# Patient Record
Sex: Male | Born: 1940 | Race: White | Hispanic: No | Smoking: Never smoker
Health system: Southern US, Community
[De-identification: ages and names within clinical notes are randomized; demographics above are authoritative.]

## PROBLEM LIST (undated history)

## (undated) DIAGNOSIS — E785 Hyperlipidemia, unspecified: Secondary | ICD-10-CM

## (undated) DIAGNOSIS — I10 Essential (primary) hypertension: Secondary | ICD-10-CM

## (undated) DIAGNOSIS — E119 Type 2 diabetes mellitus without complications: Secondary | ICD-10-CM

## (undated) DIAGNOSIS — F419 Anxiety disorder, unspecified: Secondary | ICD-10-CM

## (undated) DIAGNOSIS — N183 Chronic kidney disease, stage 3 (moderate): Secondary | ICD-10-CM

## (undated) DIAGNOSIS — E1169 Type 2 diabetes mellitus with other specified complication: Secondary | ICD-10-CM

## (undated) DIAGNOSIS — R251 Tremor, unspecified: Secondary | ICD-10-CM

## (undated) HISTORY — DX: Chronic kidney disease, stage 3 (moderate): N18.3

## (undated) HISTORY — DX: Essential (primary) hypertension: I10

## (undated) HISTORY — DX: Type 2 diabetes mellitus with other specified complication: E11.69

## (undated) HISTORY — DX: Anxiety disorder, unspecified: F41.9

## (undated) HISTORY — DX: Type 2 diabetes mellitus without complications: E11.9

## (undated) HISTORY — DX: Tremor, unspecified: R25.1

## (undated) HISTORY — DX: Hyperlipidemia, unspecified: E78.5

---

## 2004-06-23 ENCOUNTER — Ambulatory Visit: Payer: Self-pay | Admitting: Internal Medicine

## 2004-06-30 ENCOUNTER — Ambulatory Visit: Payer: Self-pay | Admitting: Internal Medicine

## 2004-07-31 ENCOUNTER — Ambulatory Visit: Payer: Self-pay | Admitting: Internal Medicine

## 2008-05-07 ENCOUNTER — Ambulatory Visit: Payer: Self-pay | Admitting: Gastroenterology

## 2011-10-20 ENCOUNTER — Ambulatory Visit: Payer: Self-pay | Admitting: Ophthalmology

## 2011-11-03 ENCOUNTER — Ambulatory Visit: Payer: Self-pay | Admitting: Ophthalmology

## 2011-12-29 ENCOUNTER — Ambulatory Visit: Payer: Self-pay | Admitting: Ophthalmology

## 2013-06-27 ENCOUNTER — Ambulatory Visit: Payer: Self-pay | Admitting: Gastroenterology

## 2013-06-29 LAB — PATHOLOGY REPORT

## 2013-09-02 DIAGNOSIS — E1169 Type 2 diabetes mellitus with other specified complication: Secondary | ICD-10-CM

## 2013-09-02 DIAGNOSIS — E785 Hyperlipidemia, unspecified: Secondary | ICD-10-CM

## 2013-09-02 DIAGNOSIS — E119 Type 2 diabetes mellitus without complications: Secondary | ICD-10-CM | POA: Insufficient documentation

## 2013-09-02 DIAGNOSIS — I10 Essential (primary) hypertension: Secondary | ICD-10-CM

## 2013-09-02 DIAGNOSIS — F419 Anxiety disorder, unspecified: Secondary | ICD-10-CM

## 2013-09-02 DIAGNOSIS — R251 Tremor, unspecified: Secondary | ICD-10-CM

## 2013-09-02 HISTORY — DX: Tremor, unspecified: R25.1

## 2013-09-02 HISTORY — DX: Hyperlipidemia, unspecified: E11.69

## 2013-09-02 HISTORY — DX: Anxiety disorder, unspecified: F41.9

## 2013-09-02 HISTORY — DX: Essential (primary) hypertension: I10

## 2013-09-02 HISTORY — DX: Type 2 diabetes mellitus without complications: E11.9

## 2013-09-12 DIAGNOSIS — N183 Chronic kidney disease, stage 3 unspecified: Secondary | ICD-10-CM

## 2013-09-12 HISTORY — DX: Chronic kidney disease, stage 3 unspecified: N18.30

## 2014-02-05 DIAGNOSIS — Z85828 Personal history of other malignant neoplasm of skin: Secondary | ICD-10-CM | POA: Insufficient documentation

## 2014-06-19 NOTE — Op Note (Signed)
PATIENT NAME:  Randy Mcmillan, MAZO MR#:  [id number] DATE OF BIRTH:  03/31/1940  DATE OF PROCEDURE:  12/29/2011  PREOPERATIVE DIAGNOSIS: Visually significant cataract of the right eye.   POSTOPERATIVE DIAGNOSIS: Visually significant cataract of the right eye.   OPERATIVE PROCEDURE: Cataract extraction by phacoemulsification with implant of intraocular lens to right eye.   SURGEON: Birder Robson, MD   ANESTHESIA:  1. Managed anesthesia care.  2. Topical tetracaine drops followed by 2% Xylocaine jelly applied in the preoperative holding area.   COMPLICATIONS: None.   TECHNIQUE:  Stop and chop.   DESCRIPTION OF PROCEDURE: The patient was examined and consented in the preoperative holding area where the aforementioned topical anesthesia was applied to the right eye and then brought back to the Operating Room where the right eye was prepped and draped in the usual sterile ophthalmic fashion and a lid speculum was placed. A paracentesis was created with the side port blade and the anterior chamber was filled with viscoelastic. A near clear corneal incision was performed with the steel keratome. A continuous curvilinear capsulorrhexis was performed with a cystotome followed by the capsulorrhexis forceps. Hydrodissection and hydrodelineation were carried out with BSS on a blunt cannula. The lens was removed in a stop and chop technique and the remaining cortical material was removed with the irrigation-aspiration handpiece. The capsular bag was inflated with viscoelastic and the Technis ZCBOO   20.5-diopter lens, serial number [id number], was placed in the capsular bag without complication. The remaining viscoelastic was removed from the eye with the irrigation-aspiration handpiece. The wounds were hydrated. The anterior chamber was flushed with Miostat and the eye was inflated to physiologic pressure. The wounds were found to be water tight. The eye was dressed with Vigamox. The patient was given  protective glasses to wear throughout the day and a shield with which to sleep tonight. The patient was also given drops with which to begin a drop regimen today and will follow-up with me in one day.   ____________________________ Livingston Diones. Newton Frutiger, MD wlp:cbb D: 12/29/2011 15:28:20 ET T: 12/29/2011 15:46:29 ET JOB#: [id number]  cc: Abhishek Levesque L. Oumar Marcott, MD, <Dictator> Livingston Diones Deriyah Kunath MD ELECTRONICALLY SIGNED 01/07/2012 10:23

## 2014-06-19 NOTE — Op Note (Signed)
PATIENT NAME:  Randy Mcmillan, Randy Mcmillan MR#:  [id number] DATE OF BIRTH:  1940/09/24  DATE OF PROCEDURE:  11/03/2011  PREOPERATIVE DIAGNOSIS: Visually significant cataract of the left eye.   POSTOPERATIVE DIAGNOSIS: Visually significant cataract of the left eye.   OPERATIVE PROCEDURE: Cataract extraction by phacoemulsification with implant of intraocular lens to the left eye.   SURGEON: Birder Robson, MD.   ANESTHESIA:  1. Managed anesthesia care.  2. Topical tetracaine drops followed by 2% Xylocaine jelly applied in the preoperative holding area.   COMPLICATIONS: None.   TECHNIQUE:  Stop and chop.   DESCRIPTION OF PROCEDURE: The patient was examined and consented in the preoperative holding area where the aforementioned topical anesthesia was applied to the left eye and then brought back to the Operating Room where the left eye was prepped and draped in the usual sterile ophthalmic fashion and a lid speculum was placed. A paracentesis was created with the side port blade and the anterior chamber was filled with viscoelastic. A near clear corneal incision was performed with the steel keratome. A continuous curvilinear capsulorrhexis was performed with a cystotome followed by the capsulorrhexis forceps. Hydrodissection and hydrodelineation were carried out with BSS on a blunt cannula. The lens was removed in a stop and chop technique and the remaining cortical material was removed with the irrigation-aspiration handpiece. The capsular bag was inflated with viscoelastic and the  Tecnis ZCB00 21-diopter lens, serial number [id number] was placed in the capsular bag without complication. The remaining viscoelastic was removed from the eye with the irrigation-aspiration handpiece. The wounds were hydrated. The anterior chamber was flushed with Miostat and the eye was inflated to physiologic pressure. The wounds were found to be water tight. The eye was dressed with Vigamox. The patient was given protective  glasses to wear throughout the day and a shield with which to sleep tonight. The patient was also given drops with which to begin a drop regimen today and will follow up with me in one day.       ____________________________ Randy Diones. Patte Winkel, MD wlp:bjt D: 11/03/2011 12:27:34 ET T: 11/03/2011 13:04:17 ET JOB#: [id number]  cc: Bronnie Vasseur L. Lavaughn Bisig, MD, <Dictator> Randy Diones Talik Casique MD ELECTRONICALLY SIGNED 11/04/2011 13:07

## 2016-12-30 DIAGNOSIS — Z0000 Encounter for general adult medical examination without abnormal findings: Secondary | ICD-10-CM | POA: Insufficient documentation

## 2017-08-10 ENCOUNTER — Encounter: Payer: Self-pay | Admitting: Urology

## 2017-08-10 ENCOUNTER — Ambulatory Visit (INDEPENDENT_AMBULATORY_CARE_PROVIDER_SITE_OTHER): Payer: Medicare Other | Admitting: Urology

## 2017-08-10 VITALS — BP 150/78 | HR 88 | Ht 74.0 in | Wt 216.0 lb

## 2017-08-10 DIAGNOSIS — R972 Elevated prostate specific antigen [PSA]: Secondary | ICD-10-CM | POA: Diagnosis not present

## 2017-08-10 DIAGNOSIS — R351 Nocturia: Secondary | ICD-10-CM

## 2017-08-10 LAB — URINALYSIS, COMPLETE
Bilirubin, UA: NEGATIVE
Leukocytes, UA: NEGATIVE
Nitrite, UA: NEGATIVE
Protein, UA: NEGATIVE
RBC, UA: NEGATIVE
Specific Gravity, UA: 1.02 (ref 1.005–1.030)
Urobilinogen, Ur: 0.2 mg/dL (ref 0.2–1.0)
pH, UA: 5 (ref 5.0–7.5)

## 2017-08-10 LAB — MICROSCOPIC EXAMINATION
Bacteria, UA: NONE SEEN
Epithelial Cells (non renal): NONE SEEN /hpf (ref 0–10)
RBC, UA: NONE SEEN /hpf (ref 0–2)

## 2017-08-10 NOTE — Progress Notes (Signed)
08/10/2017 9:58 AM   Randy Mcmillan [birth year] [id number]  Referring provider: Kirk Ruths, MD [facility] [place], [place] [address]  Chief Complaint  Patient presents with  . Elevated PSA    New Patient    HPI: 77 year old male referred for elevated and rising PSA.  PSA trend as below.  No family of prostate cancer.  No weight loss or bone pain.    He denies any significant urinary symptoms including frequency/ urgency.  He does get up 1-2 x at night if he drink mixed drinks before bed.  If he does not drink these cocktails, he has no issues.  No gross hematuria.  He does have occation rare dysuria which is not consistent or severe.    PSA trend: 4.69 06/23/2017 3.69 09/18/2015 3.60 09/17/2014 2.58 09/05/2013   PMH: Past Medical History:  Diagnosis Date  . Anxiety 09/02/2013   On rare 1/2 mg xanax   Last Assessment & Plan:  Doing well on rare xanax  . Chronic kidney disease, stage III (moderate) ([place]) 09/12/2013  . Essential (primary) hypertension 09/02/2013   Last Assessment & Plan:  Taking medications without noted side effects or dizziness.  . Hyperlipidemia associated with type 2 diabetes mellitus ([place]) 09/02/2013   Last Assessment & Plan:  Diet for healthy cholesterol is being attempted and no clear myalgia's or other side effects are noted.  . Tremor 09/02/2013   Last Assessment & Plan:  Tremor is essentially unchanged.  . Type 2 diabetes mellitus ([place]) 09/02/2013   Last Assessment & Plan:  Has been working on a low fat diet and no myalgia's are present.    Surgical History: History reviewed. No pertinent surgical history.  Home Medications:  Allergies as of 08/10/2017   No Known Allergies     Medication List        Accurate as of 08/10/17  9:58 AM. Always use your most recent med list.          aspirin 81 MG chewable tablet Chew by mouth.   atorvastatin 40 MG tablet Commonly known as:  LIPITOR Take by mouth.   glipiZIDE 5 MG tablet Commonly known as:  GLUCOTROL Take by mouth.   JANUVIA 100 MG tablet Generic drug:  sitaGLIPtin   lisinopril 10 MG tablet Commonly known as:  PRINIVIL,ZESTRIL Take by mouth.   metFORMIN 1000 MG tablet Commonly known as:  GLUCOPHAGE Take by mouth.   sildenafil 20 MG tablet Commonly known as:  REVATIO Take by mouth.       Allergies: No Known Allergies  Family History: History reviewed. No pertinent family history.  Social History:  reports that he has never smoked. He has never used smokeless tobacco. He reports that he drinks alcohol. He reports that he does not use drugs.  ROS: UROLOGY Frequent Urination?: No Hard to postpone urination?: No Burning/pain with urination?: Yes Get up at night to urinate?: Yes Leakage of urine?: No Urine stream starts and stops?: No Trouble starting stream?: No Do you have to strain to urinate?: No Blood in urine?: No Urinary tract infection?: No Sexually transmitted disease?: No Injury to kidneys or bladder?: No Painful intercourse?: No Weak stream?: No Erection problems?: Yes Penile pain?: No  Gastrointestinal Nausea?: No Vomiting?: No Indigestion/heartburn?: No Diarrhea?: No Constipation?: No  Constitutional Fever: No Night sweats?: No Weight loss?: No Fatigue?: No  Skin Skin rash/lesions?: No Itching?: No  Eyes Blurred vision?: No Double vision?: No  Ears/Nose/Throat  Sore throat?: No Sinus problems?: Yes  Hematologic/Lymphatic Swollen glands?: No Easy bruising?: Yes  Cardiovascular Leg swelling?: No Chest pain?: No  Respiratory Cough?: No Shortness of breath?: No  Endocrine Excessive thirst?: No  Musculoskeletal Back pain?: No Joint pain?: Yes  Neurological Headaches?: No Dizziness?: No  Psychologic Depression?: No Anxiety?: No  Physical Exam: BP (!) 150/78   Pulse 88   Ht 6\' 2"  (1.88 m)   Wt 216 lb (98 kg)   BMI 27.73 kg/m   Constitutional:  Alert and  oriented, No acute distress. HEENT: [place] AT, moist mucus membranes.  Trachea midline, no masses. Cardiovascular: No clubbing, cyanosis, or edema. Respiratory: Normal respiratory effort, no increased work of breathing. GI: Abdomen is soft, nontender, nondistended, no abdominal masses GU: No CVA tenderness Rectal: Slightly decreased sphincter tone.  Enlarged 50+ cc prostate, nontender, no nodules.. Skin: No rashes, bruises or suspicious lesions. Neurologic: Grossly intact, no focal deficits, moving all 4 extremities. Psychiatric: Normal mood and affect.  Laboratory Data: PSA trend as above  Hemoglobin A1c 8.8 05/2017 Creatinine 1.2  Urinalysis UA today significant only for trace glucose and ketones, otherwise unremarkable without evidence of infection or microscopic hematuria. or microscopic hematuria.    Pertinent Imaging: N/A  Assessment & Plan:    1. Elevated PSA  We reviewed the implications of an elevated PSA and the uncertainty surrounding it. In general, a man's PSA increases with age and is produced by both normal and cancerous prostate tissue. Differential for elevated PSA is BPH, prostate cancer, infection, recent intercourse/ejaculation, prostate infarction, recent urethroscopic manipulation (foley placement/cystoscopy) and prostatitis. Management of an elevated PSA can include observation or prostate biopsy and wediscussed this in detail.  We discussed that indications for prostate biopsy are defined by age and race specific PSA cutoffs as well as a PSA velocity of 0.75/year.  Rectal exam today is reassuring, prostamegaly appreciated thus PSA may be appropriate for the size gland.  Repeat PSA today.  Will likely continue to follow with repeat PSA in 6 months 1 year depending on repeat results today.  He is agreeable to this plan.  - Urinalysis, Complete - PSA  2. 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  Hollice Espy, MD  [facility] [address] [place], [place] [address] [phone]

## 2017-08-11 ENCOUNTER — Encounter: Payer: Self-pay | Admitting: Family Medicine

## 2017-08-11 ENCOUNTER — Telehealth: Payer: Self-pay | Admitting: Family Medicine

## 2017-08-11 LAB — PSA: Prostate Specific Ag, Serum: 4.5 ng/mL — ABNORMAL HIGH (ref 0.0–4.0)

## 2017-08-11 NOTE — Telephone Encounter (Signed)
-----   Message from Hollice Espy, MD sent at 08/11/2017  8:11 AM EDT ----- PSA stable at 4.5.  Lets recheck in 6 months as discussed.    Hollice Espy, MD

## 2017-08-11 NOTE — Telephone Encounter (Signed)
Letter sent.

## 2018-02-08 ENCOUNTER — Encounter: Payer: Self-pay | Admitting: Urology

## 2018-02-08 ENCOUNTER — Ambulatory Visit (INDEPENDENT_AMBULATORY_CARE_PROVIDER_SITE_OTHER): Payer: Medicare Other | Admitting: Urology

## 2018-02-08 VITALS — BP 149/85 | HR 103 | Ht 74.0 in | Wt 213.0 lb

## 2018-02-08 DIAGNOSIS — R972 Elevated prostate specific antigen [PSA]: Secondary | ICD-10-CM

## 2018-02-08 NOTE — Progress Notes (Signed)
02/08/2018 11:28 AM   Randy Mcmillan 09-12-1940 [id number]  Referring provider: Kirk Ruths, MD [facility] [place], [place] [address]  Chief Complaint  Patient presents with  . Elevated PSA    HPI: 77 year old male with a history of elevated/rising PSA returns today for six-month follow-up.  His PSA trend as below.  Unfortunately, he did not have his PSA drawn prior to today's appointment but is lab results are still pending.  No family history of prostate cancer.  No weight loss or bone pain.  He denies any significant urinary symptoms as per previous.  Unchanged.  PSA trend: 4.5 08/10/2017 4.69 06/23/2017 3.69 09/18/2015 3.60 09/17/2014 2.58 09/05/2013   PMH: Past Medical History:  Diagnosis Date  . Anxiety 09/02/2013   On rare 1/2 mg xanax   Last Assessment & Plan:  Doing well on rare xanax  . Chronic kidney disease, stage III (moderate) ([place]) 09/12/2013  . Essential (primary) hypertension 09/02/2013   Last Assessment & Plan:  Taking medications without noted side effects or dizziness.  . Hyperlipidemia associated with type 2 diabetes mellitus ([place]) 09/02/2013   Last Assessment & Plan:  Diet for healthy cholesterol is being attempted and no clear myalgia's or other side effects are noted.  . Tremor 09/02/2013   Last Assessment & Plan:  Tremor is essentially unchanged.  . Type 2 diabetes mellitus ([place]) 09/02/2013   Last Assessment & Plan:  Has been working on a low fat diet and no myalgia's are present.    Surgical History: No past surgical history on file.  Home Medications:  Allergies as of 02/08/2018   No Known Allergies     Medication List        Accurate as of 02/08/18 11:28 AM. Always use your most recent med list.          aspirin 81 MG chewable tablet Chew by mouth.   atorvastatin 40 MG tablet Commonly known as:  LIPITOR Take by mouth.   gabapentin 100 MG capsule Commonly known as:  NEURONTIN Take by  mouth.   glipiZIDE 5 MG tablet Commonly known as:  GLUCOTROL Take by mouth.   JANUVIA 100 MG tablet Generic drug:  sitaGLIPtin   lisinopril 10 MG tablet Commonly known as:  PRINIVIL,ZESTRIL Take by mouth.   metFORMIN 1000 MG tablet Commonly known as:  GLUCOPHAGE Take by mouth.   sildenafil 20 MG tablet Commonly known as:  REVATIO Take by mouth.       Allergies: No Known Allergies  Family History: No family history on file.  Social History:  reports that he has never smoked. He has never used smokeless tobacco. He reports that he drinks alcohol. He reports that he does not use drugs.  ROS: UROLOGY Frequent Urination?: Yes Hard to postpone urination?: No Burning/pain with urination?: No Get up at night to urinate?: Yes Leakage of urine?: No Urine stream starts and stops?: No Trouble starting stream?: No Do you have to strain to urinate?: No Blood in urine?: No Urinary tract infection?: No Sexually transmitted disease?: No Injury to kidneys or bladder?: No Painful intercourse?: No Weak stream?: No Erection problems?: Yes Penile pain?: No  Gastrointestinal Nausea?: No Vomiting?: No Indigestion/heartburn?: No Diarrhea?: No Constipation?: No  Constitutional Fever: No Night sweats?: No Weight loss?: No Fatigue?: No  Skin Skin rash/lesions?: No Itching?: No  Eyes Blurred vision?: No Double vision?: No  Ears/Nose/Throat Sore throat?: No Sinus problems?: Yes  Hematologic/Lymphatic Swollen glands?:  No Easy bruising?: Yes  Cardiovascular Leg swelling?: No Chest pain?: No  Respiratory Cough?: No Shortness of breath?: No  Endocrine Excessive thirst?: No  Musculoskeletal Back pain?: No Joint pain?: No  Neurological Headaches?: No Dizziness?: No  Psychologic Depression?: No Anxiety?: No  Physical Exam: BP (!) 149/85   Pulse (!) 103   Ht 6\' 2"  (1.88 m)   Wt 213 lb (96.6 kg)   BMI 27.35 kg/m   Constitutional:  Alert and  oriented, No acute distress. HEENT: [place] AT, moist mucus membranes.  Trachea midline, no masses. Cardiovascular: No clubbing, cyanosis, or edema. Respiratory: Normal respiratory effort, no increased work of breathing. Skin: No rashes, bruises or suspicious lesions. Neurologic: Grossly intact, no focal deficits, moving all 4 extremities. Psychiatric: Normal mood and affect.  Laboratory Data: PSA trend as above  Assessment & Plan:    1. Elevated PSA Personal history of elevated PSA with enlarged gland, rectal exam otherwise unremarkable on 07/2017 In this point time, we will continue to follow him conservatively given his age PSA today pending, will call him with the results If his PSA begins to rise again, will consider prostate biopsy We did discuss risk and benefits of this in detail Plan for follow-up in 6 months with PSA DRE depending PSA results today   - PSA   Return in about 6 months (around 08/10/2018) for PSA prior / DRE.  Hollice Espy, MD  [facility] [address] [place], [place] [address] [phone]

## 2018-02-09 ENCOUNTER — Telehealth: Payer: Self-pay | Admitting: Urology

## 2018-02-09 ENCOUNTER — Other Ambulatory Visit: Payer: Self-pay | Admitting: Urology

## 2018-02-09 DIAGNOSIS — R972 Elevated prostate specific antigen [PSA]: Secondary | ICD-10-CM

## 2018-02-09 LAB — PSA: PROSTATE SPECIFIC AG, SERUM: 4.8 ng/mL — AB (ref 0.0–4.0)

## 2018-02-09 NOTE — Telephone Encounter (Signed)
Called patient with PSA results.  PSA continues to rise, now up to 4.8 up slightly from 4.5.  Overall trend is upwards.  We discussed options including prostate biopsy versus prostate MRI.  Given his age and comorbidities, he would like to pursue MRI as a first step for further diagnostic evaluation.  This is quite reasonable.  Order was placed.  If it is reassuring, we will call the patient with results.  If there is a lesion no more significant findings, will have him come back to the office.  He is agreeable this plan.  Hollice Espy, MD

## 2018-02-18 ENCOUNTER — Other Ambulatory Visit: Payer: Self-pay | Admitting: Sports Medicine

## 2018-02-18 DIAGNOSIS — M7072 Other bursitis of hip, left hip: Secondary | ICD-10-CM

## 2018-02-18 DIAGNOSIS — S76312D Strain of muscle, fascia and tendon of the posterior muscle group at thigh level, left thigh, subsequent encounter: Secondary | ICD-10-CM

## 2018-02-18 DIAGNOSIS — M25552 Pain in left hip: Secondary | ICD-10-CM

## 2018-03-11 ENCOUNTER — Ambulatory Visit
Admission: RE | Admit: 2018-03-11 | Discharge: 2018-03-11 | Disposition: A | Discharge status: Home / Self Care | Payer: Medicare Other | Source: Ambulatory Visit | Attending: Urology | Admitting: Urology

## 2018-03-11 ENCOUNTER — Ambulatory Visit
Admission: RE | Admit: 2018-03-11 | Discharge: 2018-03-11 | Disposition: A | Discharge status: Home / Self Care | Payer: Medicare Other | Source: Ambulatory Visit | Attending: Sports Medicine | Admitting: Sports Medicine

## 2018-03-11 DIAGNOSIS — S76312D Strain of muscle, fascia and tendon of the posterior muscle group at thigh level, left thigh, subsequent encounter: Secondary | ICD-10-CM

## 2018-03-11 DIAGNOSIS — M25552 Pain in left hip: Secondary | ICD-10-CM

## 2018-03-11 DIAGNOSIS — R972 Elevated prostate specific antigen [PSA]: Secondary | ICD-10-CM | POA: Diagnosis present

## 2018-03-11 DIAGNOSIS — M7072 Other bursitis of hip, left hip: Secondary | ICD-10-CM

## 2018-03-11 LAB — POCT I-STAT CREATININE: Creatinine, Ser: 1.3 mg/dL — ABNORMAL HIGH (ref 0.61–1.24)

## 2018-03-11 MED ORDER — GADOBUTROL 1 MMOL/ML IV SOLN
9.6000 mL | Freq: Once | INTRAVENOUS | Status: AC | PRN
  Administered 2018-03-11: 9.6 mL via INTRAVENOUS

## 2018-03-14 ENCOUNTER — Telehealth: Payer: Self-pay | Admitting: Urology

## 2018-03-14 NOTE — Telephone Encounter (Signed)
MRI results reviewed with the patient, aware of PI-RADS 4 lesion.  Also discussed case with reading radiologist, unfortunately not protocoled for fusion biopsy which radiology is working to correct in the future.  We discussed prostate biopsy in the office in detail today.  Given that were not unable to do a fusion biopsy with current imaging, will do cognitive fusion biopsy to start with and repeat the MRI if needed if the biopsy is not representative.  Patient is agreeable this plan.  We discussed prostate biopsy in detail including the procedure itself, the risks of blood in the urine, stool, and ejaculate, serious infection, and discomfort. He is willing to proceed with this as discussed.  Please arrange for prostate biopsy in our office.  Please ensure that he hold his aspirin 1 week prior, was told on the phone today.  Aware of need for enema.  He would also like a copy of the preprocedure instruction sheets mailed to his house.

## 2018-03-15 NOTE — Telephone Encounter (Signed)
Patient called back and voiced understanding   Randy Mcmillan

## 2018-03-15 NOTE — Telephone Encounter (Signed)
Called patient to schedule had to leave a message on his cell to cb to confirm His home phone the call kept dropping  Made appt and mailed along with the instructions  Sharyn Lull

## 2018-03-30 ENCOUNTER — Ambulatory Visit (INDEPENDENT_AMBULATORY_CARE_PROVIDER_SITE_OTHER): Payer: Medicare Other | Admitting: Urology

## 2018-03-30 ENCOUNTER — Encounter: Payer: Self-pay | Admitting: Urology

## 2018-03-30 ENCOUNTER — Other Ambulatory Visit: Payer: Self-pay | Admitting: Urology

## 2018-03-30 VITALS — BP 111/74 | HR 111 | Ht 74.0 in | Wt 210.0 lb

## 2018-03-30 DIAGNOSIS — R972 Elevated prostate specific antigen [PSA]: Secondary | ICD-10-CM | POA: Diagnosis not present

## 2018-03-30 MED ORDER — LEVOFLOXACIN 500 MG PO TABS
500.0000 mg | ORAL_TABLET | Freq: Once | ORAL | Status: AC
  Administered 2018-03-30: 500 mg via ORAL

## 2018-03-30 MED ORDER — GENTAMICIN SULFATE 40 MG/ML IJ SOLN
80.0000 mg | Freq: Once | INTRAMUSCULAR | Status: AC
  Administered 2018-03-30 (×2): 80 mg via INTRAMUSCULAR

## 2018-03-30 NOTE — Progress Notes (Signed)
   03/30/18  CC:  Chief Complaint  Patient presents with  . Prostate Biopsy    HPI: 78 year old male with rising PSA as well as a PI-RADS 4 lesion on MRI who presents today for prostate biopsy.  Blood pressure 111/74, pulse (!) 111, height 6\' 2"  (1.88 m), weight 210 lb (95.3 kg). NED. A&Ox3.   No respiratory distress   Abd soft, NT, ND Normal sphincter tone  Prostate Biopsy Procedure   Informed consent was obtained after discussing risks/benefits of the procedure.  A time out was performed to ensure correct patient identity.  Pre-Procedure: - Gentamicin given prophylactically - Levaquin 500 mg administered PO -Transrectal Ultrasound performed revealing a 53.25 gm prostate -No significant hypoechoic or median lobe noted  Procedure: - Prostate block performed using 10 cc 1% lidocaine and biopsies taken from sextant areas, a total of 12 under ultrasound guidance.  Post-Procedure: - Patient tolerated the procedure well - He was counseled to seek immediate medical attention if experiences any severe pain, significant bleeding, or fevers - Return in two week to discuss biopsy results   Hollice Espy, MD

## 2018-04-06 ENCOUNTER — Other Ambulatory Visit: Payer: Self-pay | Admitting: Urology

## 2018-04-06 LAB — PATHOLOGY REPORT

## 2018-04-07 ENCOUNTER — Telehealth: Payer: Self-pay

## 2018-04-07 NOTE — Telephone Encounter (Signed)
-----   Message from Hollice Espy, MD sent at 04/06/2018  4:48 PM EST ----- Please let this patient know that his prostate biopsy showed no evidence of cancer.  I  would like to discuss this further with him in the office.  Please advise him to keep his follow-up with me.  Hollice Espy, MD

## 2018-04-07 NOTE — Telephone Encounter (Signed)
Advised patient of his results per the message below He will keep his follow up appointment on 04-14-18 to discuss further per Dr. Cherrie Gauze request.   Sharyn Lull

## 2018-04-07 NOTE — Telephone Encounter (Signed)
Left message to return call to office.

## 2018-04-14 ENCOUNTER — Ambulatory Visit (INDEPENDENT_AMBULATORY_CARE_PROVIDER_SITE_OTHER): Payer: Medicare Other | Admitting: Urology

## 2018-04-14 ENCOUNTER — Encounter: Payer: Self-pay | Admitting: Urology

## 2018-04-14 VITALS — BP 109/74 | HR 112 | Ht 74.0 in | Wt 215.0 lb

## 2018-04-14 DIAGNOSIS — N401 Enlarged prostate with lower urinary tract symptoms: Secondary | ICD-10-CM | POA: Diagnosis not present

## 2018-04-14 DIAGNOSIS — N138 Other obstructive and reflux uropathy: Secondary | ICD-10-CM | POA: Diagnosis not present

## 2018-04-14 DIAGNOSIS — R972 Elevated prostate specific antigen [PSA]: Secondary | ICD-10-CM

## 2018-04-14 MED ORDER — FINASTERIDE 5 MG PO TABS: 5.0000 mg | ORAL_TABLET | Freq: Every day | ORAL | 11 refills | Status: DC

## 2018-04-14 NOTE — Progress Notes (Signed)
04/14/2018 5:01 PM   Randy Mcmillan 1940-05-05 [id number]  Referring provider: Kirk Ruths, MD [facility] [place], [place] [address]  Chief Complaint  Patient presents with  . Results    Biopsy Results    HPI: Randy Mcmillan. is a [name] who returns today for the evaluation and management of elevated PSA. His PSA trend is below.   He has no FHx of prostate cancer. No weight loss or bone pain.   Pt elected to pursue MRI as a first step for further diagnostic evaluation given his age and comorbidities over a phone call on 03/14/2018. On his MRI he had a PI-RADS 4 lesion in which he elected to have a prostate biopsy on 03/30/2018.   His prostate bx results showed no evidence of cancer. His TRUS volume is 53 grams.   He does report of post void dribbling and nocturia intermittently. He does not find these symptoms bothersome prior to urological evaluation but has been paying closer attention to these and has had increased bother.  He also has a history of diabetes.  PSA trend: 4.8 02/08/2018 4.5 08/10/2017 4.69 06/23/2017 3.69 09/18/2015 3.60 09/17/2014 2.587/08/2013   PMH: Past Medical History:  Diagnosis Date  . Anxiety 09/02/2013   On rare 1/2 mg xanax   Last Assessment & Plan:  Doing well on rare xanax  . Chronic kidney disease, stage III (moderate) ([place]) 09/12/2013  . Essential (primary) hypertension 09/02/2013   Last Assessment & Plan:  Taking medications without noted side effects or dizziness.  . Hyperlipidemia associated with type 2 diabetes mellitus ([place]) 09/02/2013   Last Assessment & Plan:  Diet for healthy cholesterol is being attempted and no clear myalgia's or other side effects are noted.  . Tremor 09/02/2013   Last Assessment & Plan:  Tremor is essentially unchanged.  . Type 2 diabetes mellitus ([place]) 09/02/2013   Last Assessment & Plan:  Has been working on a low fat diet and no myalgia's are present.    Surgical  History: No past surgical history on file.  Home Medications:  Allergies as of 04/14/2018   No Known Allergies     Medication List       Accurate as of April 14, 2018  5:01 PM. Always use your most recent med list.        aspirin 81 MG chewable tablet Chew by mouth.   atorvastatin 40 MG tablet Commonly known as:  LIPITOR Take by mouth.   finasteride 5 MG tablet Commonly known as:  PROSCAR Take 1 tablet (5 mg total) by mouth daily.   gabapentin 100 MG capsule Commonly known as:  NEURONTIN Take by mouth.   glipiZIDE 5 MG tablet Commonly known as:  GLUCOTROL Take by mouth.   JANUVIA 100 MG tablet Generic drug:  sitaGLIPtin   lisinopril 10 MG tablet Commonly known as:  PRINIVIL,ZESTRIL Take by mouth.   metFORMIN 1000 MG tablet Commonly known as:  GLUCOPHAGE Take by mouth.   sildenafil 20 MG tablet Commonly known as:  REVATIO Take by mouth.       Allergies: No Known Allergies  Family History: No family history on file.  Social History:  reports that he has never smoked. He has never used smokeless tobacco. He reports current alcohol use. He reports that he does not use drugs.  ROS: UROLOGY Frequent Urination?: Yes Hard to postpone urination?: No Burning/pain with urination?: No Get up at night  to urinate?: Yes Leakage of urine?: No Urine stream starts and stops?: No Trouble starting stream?: No Do you have to strain to urinate?: No Blood in urine?: No Urinary tract infection?: No Sexually transmitted disease?: No Injury to kidneys or bladder?: No Painful intercourse?: No Weak stream?: No Erection problems?: Yes Penile pain?: No  Gastrointestinal Nausea?: No Vomiting?: No Indigestion/heartburn?: No Diarrhea?: No Constipation?: No  Constitutional Fever: No Night sweats?: No Weight loss?: No Fatigue?: No  Skin Skin rash/lesions?: No Itching?: No  Eyes Blurred vision?: No Double vision?: No  Ears/Nose/Throat Sore throat?:  No Sinus problems?: No  Hematologic/Lymphatic Swollen glands?: No Easy bruising?: No  Cardiovascular Leg swelling?: No Chest pain?: No  Respiratory Cough?: No Shortness of breath?: No  Endocrine Excessive thirst?: No  Musculoskeletal Back pain?: No Joint pain?: No  Neurological Headaches?: No Dizziness?: No  Psychologic Depression?: No Anxiety?: Yes  Physical Exam: BP 109/74   Pulse (!) 112   Ht 6\' 2"  (1.88 m)   Wt 215 lb (97.5 kg)   BMI 27.60 kg/m   Constitutional:  Alert and oriented, No acute distress. HEENT: [place] AT, moist mucus membranes.  Trachea midline, no masses. Cardiovascular: No clubbing, cyanosis, or edema. Respiratory: Normal respiratory effort, no increased work of breathing. Skin: No rashes, bruises or suspicious lesions. Neurologic: Grossly intact, no focal deficits, moving all 4 extremities. Psychiatric: Normal mood and affect.  Pertinent Imaging: CLINICAL DATA:  Elevated PSA level, 4.8 about 1 month ago  EXAM: MR PROSTATE WITHOUT AND WITH CONTRAST  TECHNIQUE: Multiplanar multisequence MRI images were obtained of the pelvis centered about the prostate. Pre and post contrast images were obtained.  CONTRAST:  9.6 cc Gadavist  COMPARISON:  None.  FINDINGS: Prostate: Primarily in the right anterior stroma of the prostate apex, with possible extension into the right anterior peripheral zone of the apex, we demonstrate a 1.3 by 0.9 by 1.0 cm focus reduced T2 and ADC map activity (image 19/19) with abnormal elevated diffusion weighted imaging characteristics (image 19/18) as well as early enhancement (image 20/33). This lesion is considered PI-RADS category 4.  Hazy in indistinctly marginated reduced T2 signal in the posterolateral and posteromedial peripheral zone of the mid gland and base, for example on image 12/17, considered PI-RADS category 2. No appreciable early enhancement or accentuated diffusion  weighted characteristics.  Volume: The prostate gland measures 5.2 by 3.8 by 4.3 cm (volume = 44 cm^3).  Transcapsular spread:  Absent  Seminal vesicle involvement: Absent  Neurovascular bundle involvement: Absent  Pelvic adenopathy: Absent  Bone metastasis: Absent  Other findings: There is some asymmetry of the seminal vesicles with right seminal vesicle slightly larger and with slightly higher T2 signal than the left, but no appreciable tumor or differential degree of enhancement in the seminal vesicles.  IMPRESSION: 1. PI-RADS category 4 lesion primarily in the right apical anterior stroma, measuring up to 1.3 cm in long axis. 2. Borderline prostatomegaly, prostate volume calculated at 44 cubic cm. 3. Because T2 CUBE images were not performed on this exam, I did not perform processing in DynaCAD. I am not certain that the axial T2 weighted thick section images would work with DynaCAD as a substitute for the CUBE images, although I am happy to give it a try upon request.  Electronically Signed: By: Van Clines M.D. On: 03/11/2018 13:54  I have personally reviewed the images and agree with radiologist interpretation.   Assessment & Plan:    1. Elevated PSA Prostate bx results showed no  evidence of cancer  Reports of post void dribbling but not bothersome, prostate enlarged  Start Finasteride 5 mg for diagnostic and therapeutic purposes  Return in 6 months with PSA prior at [facility]   2. Prostate lesion on MRI  PI-RADS 4 lesion on MRI concerning  Non-fusion biopsy negative  If PSA decreased by 50 percent interval, will recommend fusion biopsy   3. BPH with obstruction  Mild obstructive, treat with finasteride   F/u 6 months with PSA prior  ( will have drawn by PCP)  [facility] [address] [place], [place] [address] [phone]  I, Lucas Mallow, am acting as a scribe for Dr. Hollice Espy,  I  have reviewed the above documentation for accuracy and completeness, and I agree with the above.   Hollice Espy, MD

## 2018-06-30 ENCOUNTER — Other Ambulatory Visit: Payer: Self-pay | Admitting: *Deleted

## 2018-06-30 MED ORDER — FINASTERIDE 5 MG PO TABS: 5.0000 mg | ORAL_TABLET | Freq: Every day | ORAL | 11 refills | Status: DC

## 2018-08-05 ENCOUNTER — Ambulatory Visit: Payer: Medicare Other | Admitting: Urology

## 2018-11-04 ENCOUNTER — Ambulatory Visit: Payer: Medicare Other | Admitting: Urology

## 2018-11-18 ENCOUNTER — Other Ambulatory Visit: Payer: Self-pay

## 2018-11-18 ENCOUNTER — Ambulatory Visit (INDEPENDENT_AMBULATORY_CARE_PROVIDER_SITE_OTHER): Payer: Medicare Other | Admitting: Urology

## 2018-11-18 ENCOUNTER — Encounter: Payer: Self-pay | Admitting: Urology

## 2018-11-18 VITALS — BP 136/69 | HR 103 | Ht 74.0 in | Wt 214.0 lb

## 2018-11-18 DIAGNOSIS — N138 Other obstructive and reflux uropathy: Secondary | ICD-10-CM | POA: Diagnosis not present

## 2018-11-18 DIAGNOSIS — N401 Enlarged prostate with lower urinary tract symptoms: Secondary | ICD-10-CM | POA: Diagnosis not present

## 2018-11-18 DIAGNOSIS — R972 Elevated prostate specific antigen [PSA]: Secondary | ICD-10-CM | POA: Diagnosis not present

## 2018-11-18 DIAGNOSIS — N5203 Combined arterial insufficiency and corporo-venous occlusive erectile dysfunction: Secondary | ICD-10-CM | POA: Diagnosis not present

## 2018-11-18 MED ORDER — SILDENAFIL CITRATE 20 MG PO TABS: 20.0000 mg | ORAL_TABLET | ORAL | 11 refills | Status: DC | PRN

## 2018-11-18 NOTE — Progress Notes (Signed)
11/18/2018 12:56 PM   Randy Mcmillan 05-31-1940 [id number]   Referring provider: Kirk Ruths, MD [facility] [place],  [place] [address]  Chief Complaint  Patient presents with  . Elevated PSA    95mo     HPI: [name] with history of elevated PSA /BPH who returns today for routine follow-up with PSA.  He has no FHx of prostate cancer. No weight loss or bone pain.   Pt elected to pursue MRI as a first step for further diagnostic evaluation given his age and comorbidities over a phone call on 03/14/2018. On his MRI he had a PI-RADS 4 lesion in which he elected to have a prostate biopsy on 03/30/2018.   His prostate bx results showed no evidence of cancer. His TRUS volume is 53 grams.   Since last visit, he was started on finasteride both for his obstructive urinary symptoms as well as diagnostic purposes.  His PSA is down trended down to 1.94 after at least 6 months of finasteride.  He has no significant urinary complaints today.  He gets up twice at night to void but otherwise has no significant urinary symptoms.  He does believe that starting the finasteride is helped his urination significantly including with flow and urinary frequency.  He does mention today that he has a longstanding history of erectile dysfunction.  He used Viagra as well as sildenafil in the remote past has not taken this in many years.  He is concerned that the finasteride may be causing erectile dysfunction but also reports that his erections are essentially unchanged.  His libido is intact.  PSA trend: 1.94 10/18/18 4.8 02/08/2018 4.5 08/10/2017 4.69 06/23/2017 3.69 09/18/2015 3.60 09/17/2014 2.587/08/2013   PMH: Past Medical History:  Diagnosis Date  . Anxiety 09/02/2013   On rare 1/2 mg xanax   Last Assessment & Plan:  Doing well on rare xanax  . Chronic kidney disease, stage III (moderate) ([place]) 09/12/2013  . Essential (primary) hypertension 09/02/2013    Last Assessment & Plan:  Taking medications without noted side effects or dizziness.  . Hyperlipidemia associated with type 2 diabetes mellitus ([place]) 09/02/2013   Last Assessment & Plan:  Diet for healthy cholesterol is being attempted and no clear myalgia's or other side effects are noted.  . Tremor 09/02/2013   Last Assessment & Plan:  Tremor is essentially unchanged.  . Type 2 diabetes mellitus ([place]) 09/02/2013   Last Assessment & Plan:  Has been working on a low fat diet and no myalgia's are present.    Surgical History: No past surgical history on file.  Home Medications:  Allergies as of 11/18/2018   No Known Allergies     Medication List       Accurate as of November 18, 2018 12:56 PM. If you have any questions, ask your nurse or doctor.        ALPRAZolam 0.5 MG tablet Commonly known as: XANAX   aspirin 81 MG chewable tablet Chew by mouth.   atorvastatin 40 MG tablet Commonly known as: LIPITOR Take by mouth.   finasteride 5 MG tablet Commonly known as: PROSCAR Take 1 tablet (5 mg total) by mouth daily.   gabapentin 100 MG capsule Commonly known as: NEURONTIN Take by mouth.   glipiZIDE 5 MG tablet Commonly known as: GLUCOTROL Take by mouth.   Januvia 100 MG tablet Generic drug: sitaGLIPtin   lisinopril 10 MG tablet Commonly known as: ZESTRIL  Take by mouth.   metFORMIN 1000 MG tablet Commonly known as: GLUCOPHAGE Take by mouth.   primidone 50 MG tablet Commonly known as: MYSOLINE   sildenafil 20 MG tablet Commonly known as: REVATIO Take by mouth. What changed: Another medication with the same name was added. Make sure you understand how and when to take each. Changed by: Hollice Espy, MD   sildenafil 20 MG tablet Commonly known as: Revatio Take 1 tablet (20 mg total) by mouth as needed. Take 1-5 tabs as needed prior to intercourse What changed: You were already taking a medication with the same name, and this prescription was added. Make sure  you understand how and when to take each. Changed by: Hollice Espy, MD       Allergies: No Known Allergies  Family History: No family history on file.  Social History:  reports that he has never smoked. He has never used smokeless tobacco. He reports current alcohol use. He reports that he does not use drugs.  ROS: UROLOGY Frequent Urination?: No Hard to postpone urination?: No Burning/pain with urination?: No Get up at night to urinate?: No Leakage of urine?: No Urine stream starts and stops?: No Trouble starting stream?: No Do you have to strain to urinate?: No Blood in urine?: No Urinary tract infection?: No Sexually transmitted disease?: No Injury to kidneys or bladder?: No Painful intercourse?: No Weak stream?: No Erection problems?: Yes Penile pain?: No  Gastrointestinal Nausea?: No Vomiting?: No Indigestion/heartburn?: No Diarrhea?: No Constipation?: No  Constitutional Fever: No Night sweats?: No Weight loss?: No Fatigue?: No  Skin Skin rash/lesions?: No Itching?: No  Eyes Blurred vision?: No Double vision?: No  Ears/Nose/Throat Sore throat?: No Sinus problems?: No  Hematologic/Lymphatic Swollen glands?: No Easy bruising?: Yes  Cardiovascular Leg swelling?: No Chest pain?: No  Respiratory Cough?: No Shortness of breath?: No  Endocrine Excessive thirst?: No  Musculoskeletal Back pain?: No Joint pain?: No  Neurological Headaches?: No Dizziness?: No  Psychologic Depression?: No Anxiety?: No  Physical Exam: BP 136/69   Pulse (!) 103   Ht 6\' 2"  (1.88 m)   Wt 214 lb (97.1 kg)   BMI 27.48 kg/m   Constitutional:  Alert and oriented, No acute distress. HEENT: [place] AT, moist mucus membranes.  Trachea midline, no masses. Cardiovascular: No clubbing, cyanosis, or edema. Respiratory: Normal respiratory effort, no increased work of breathing. Skin: No rashes, bruises or suspicious lesions. Neurologic: Grossly intact, no focal  deficits, moving all 4 extremities. Psychiatric: Normal mood and affect.  Laboratory Data: PSA as above  Assessment & Plan:    1. Elevated PSA Present treatment elevated PSA with abnormal prostate MRI lesion, PI-RADS 4 status post negative prostate biopsy His PSA has responded appropriately to finasteride which is very reassuring I would still like to follow closely given the lesion on MR, PSA in 6 months and PSA rectal exam with me in 1 year He is agreeable this plan - PSA; Standing  2. 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  Hollice Espy, MD  [facility] [address] [place], [place] [address] [phone]

## 2019-05-13 IMAGING — MR MR PELVIS W/O CM
4 series · 45 of 48 positions shown · non-contrast
Comparison: None.

CLINICAL DATA: Left hip pain radiating down the back to the leg

EXAM:
MRI PELVIS WITHOUT CONTRAST
TECHNIQUE: Multiplanar multisequence MR imaging of the pelvis was performed. No
intravenous contrast was administered.

[Series 10: STIR · coronal · 4.0mm · 1.25mm/px · 10 of 40 slices shown (1 of 2)]
[im 1/40]
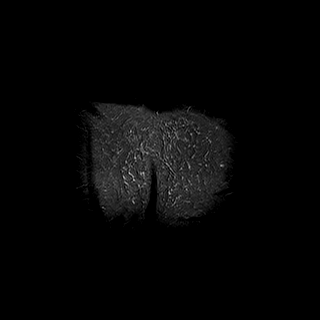
[im 5/40]
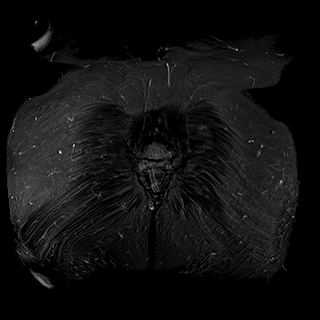
[im 9/40]
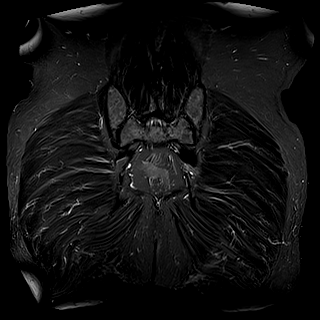
[im 14/40]
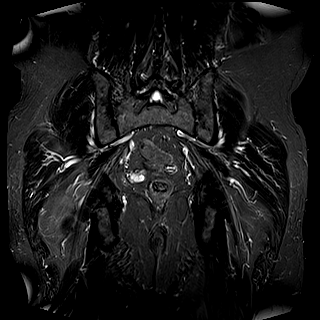
[im 18/40]
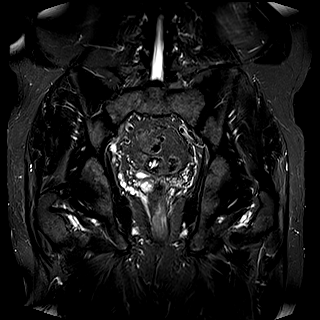
[im 22/40]
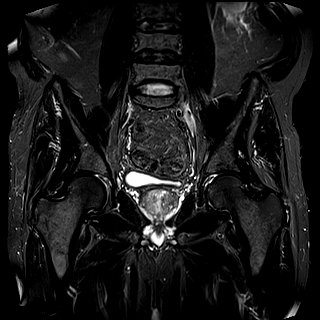
[im 27/40]
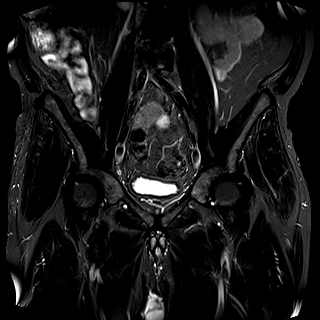
[im 31/40]
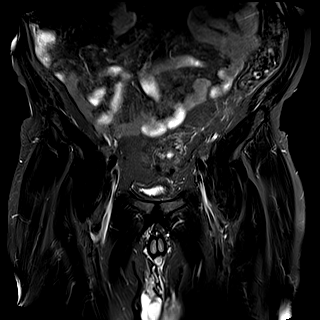
[im 35/40]
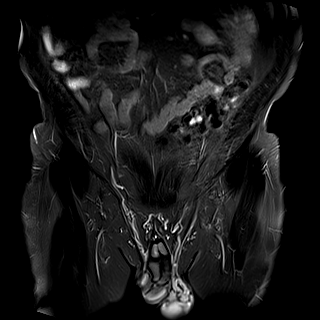
[im 40/40]
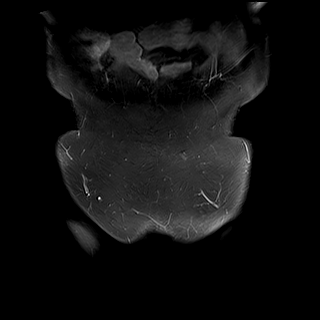

[Series 11: T1 · coronal · 4.0mm · 1.25mm/px · 9 of 40 slices shown]
[im 1/40]
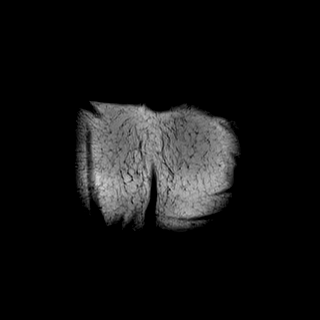
[im 5/40]
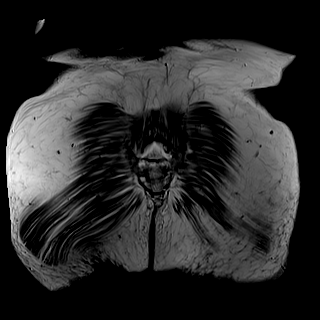
[im 10/40]
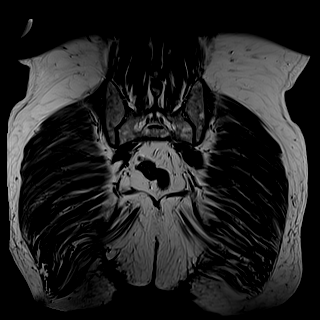
[im 15/40]
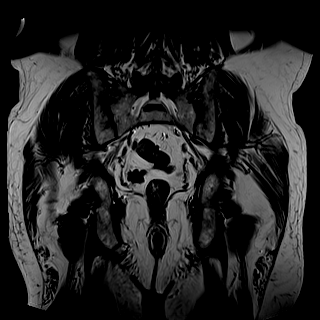
[im 20/40]
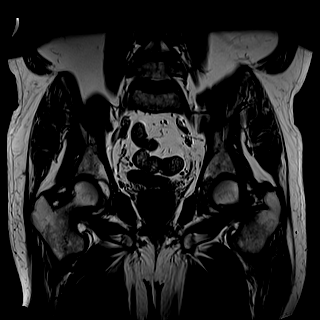
[im 25/40]
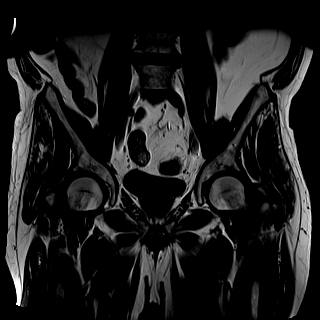
[im 30/40]
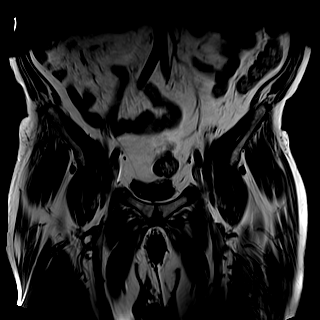
[im 35/40]
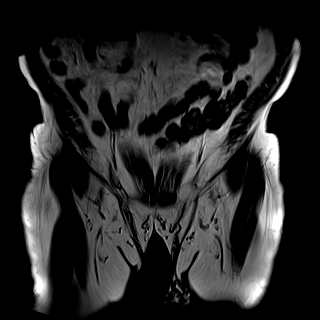
[im 40/40]
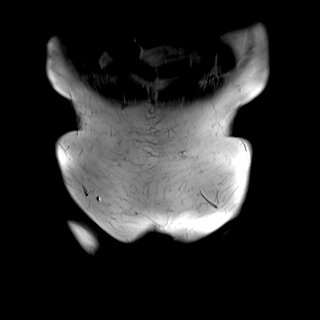

[Series 12: T2 fat-sat · sagittal · 4.0mm · 0.94mm/px · 17 of 72 slices shown]
[im 1/72]
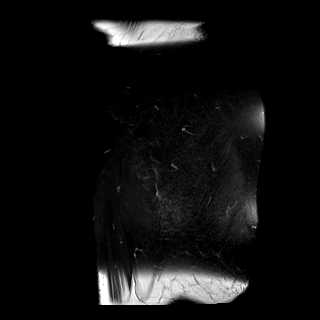
[im 5/72]
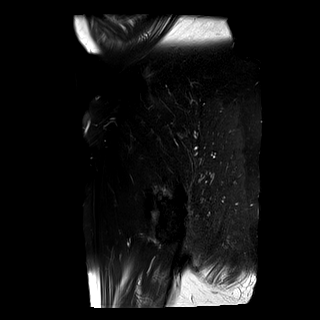
[im 9/72]
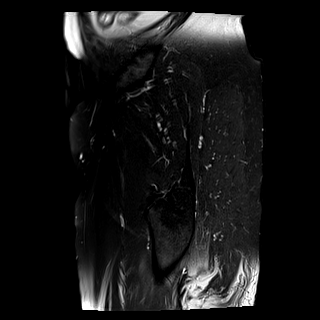
[im 14/72]
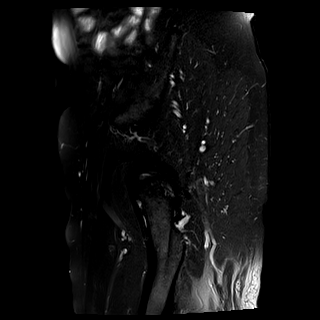
[im 18/72]
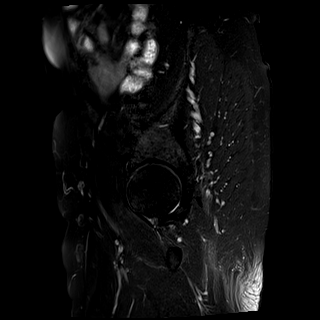
[im 23/72]
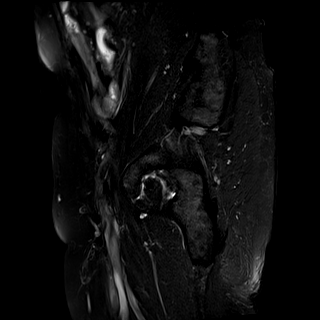
[im 27/72]
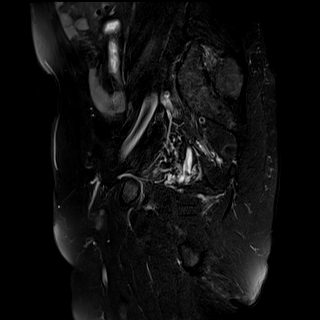
[im 32/72]
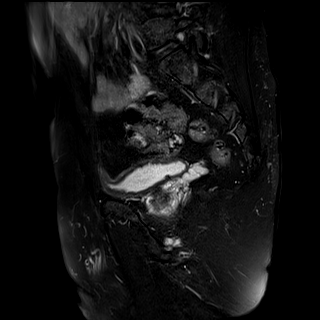
[im 36/72]
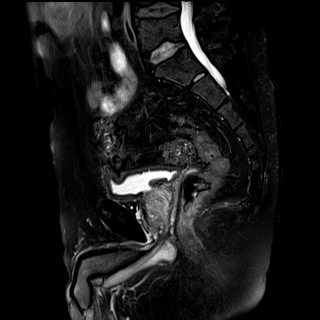
[im 40/72]
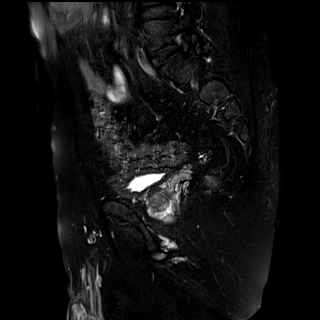
[im 45/72]
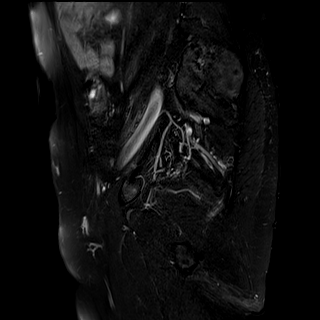
[im 49/72]
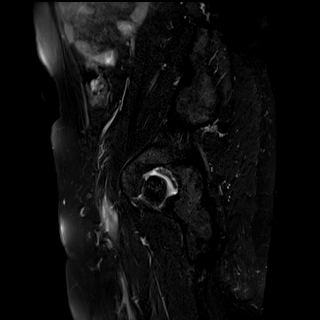
[im 54/72]
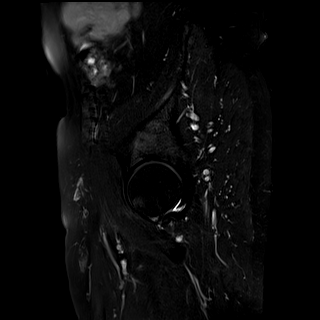
[im 58/72]
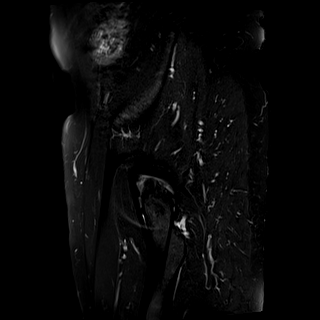
[im 63/72]
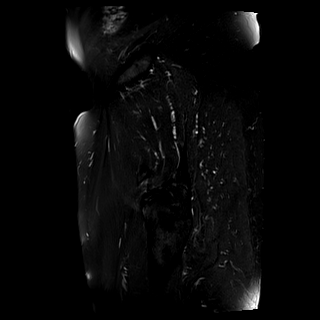
[im 67/72]
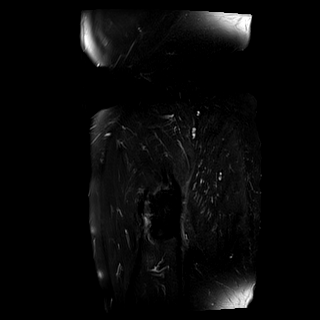
[im 72/72]
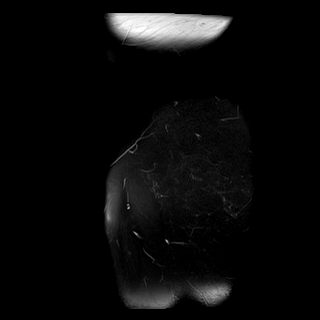

[Series 14: STIR · axial · 4.0mm · 1.19mm/px · z∈[-153,+102]mm · 9 of 52 slices shown (2 of 2)]
[im 1/52]
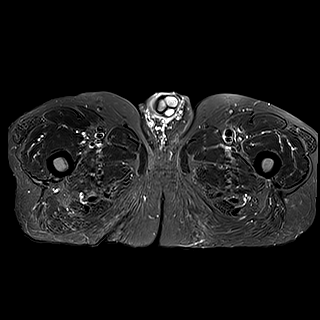
[im 10/52]
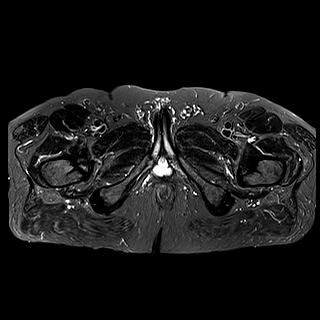
[im 14/52]
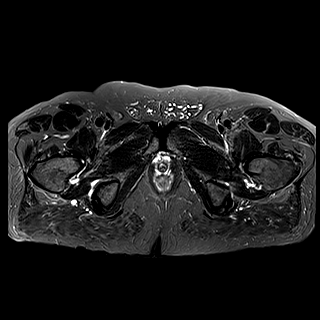
[im 24/52]
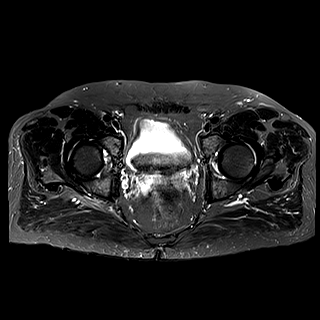
[im 28/52]
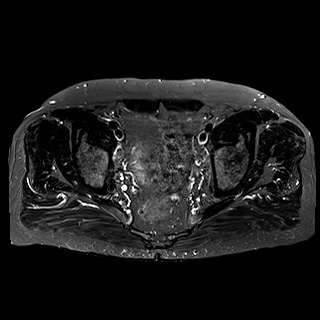
[im 38/52]
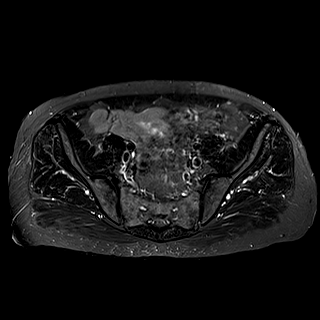
[im 42/52]
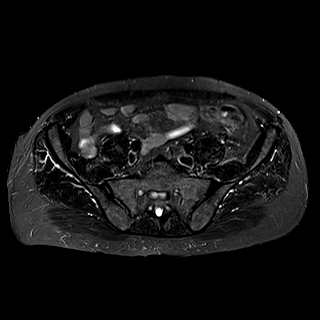
[im 47/52]
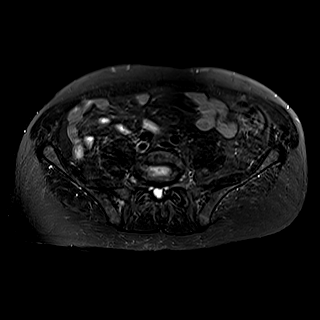
[im 52/52]
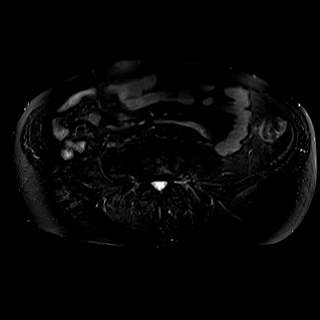

[45 of 48 positions shown; findings below may reference images not displayed]

FINDINGS: Bones: No hip fracture, dislocation or avascular necrosis. No
periosteal reaction or bone destruction. No aggressive osseous
lesion.

Normal sacrum and sacroiliac joints. No SI joint widening or erosive
changes.

Articular cartilage and labrum

Articular cartilage:  No chondral defect.

Labrum: Grossly intact, but evaluation is limited by lack of
intraarticular fluid.

Joint or bursal effusion

Joint effusion:  No hip joint effusion.  No SI joint effusion.

Bursae:  No bursa formation.

Muscles and tendons

Flexors: Normal.

Extensors: Normal.

Abductors: Normal.

Adductors: Normal.

Gluteals: Mild tendinosis of the left gluteus minimus insertion with
a small partial-thickness tear.

Hamstrings: Normal.

Other findings

Miscellaneous: No pelvic free fluid. No fluid collection or
hematoma. No inguinal lymphadenopathy. No inguinal hernia.
IMPRESSION: 1. No hip fracture, dislocation or avascular necrosis.
2. Mild tendinosis of the left gluteus minimus insertion with a
small partial-thickness tear.

## 2019-11-13 ENCOUNTER — Other Ambulatory Visit: Payer: Self-pay

## 2019-11-13 ENCOUNTER — Other Ambulatory Visit
Admission: RE | Admit: 2019-11-13 | Discharge: 2019-11-13 | Disposition: A | Discharge status: Home / Self Care | Payer: Medicare Other | Attending: Urology | Admitting: Urology

## 2019-11-13 DIAGNOSIS — N401 Enlarged prostate with lower urinary tract symptoms: Secondary | ICD-10-CM | POA: Diagnosis present

## 2019-11-13 DIAGNOSIS — R972 Elevated prostate specific antigen [PSA]: Secondary | ICD-10-CM | POA: Diagnosis present

## 2019-11-13 DIAGNOSIS — N138 Other obstructive and reflux uropathy: Secondary | ICD-10-CM

## 2019-11-13 LAB — PSA: Prostatic Specific Antigen: 3.1 ng/mL (ref 0.00–4.00)

## 2019-11-16 NOTE — Progress Notes (Signed)
11/17/2019 2:06 PM   Randy Mcmillan [birth year] [id number]  Referring provider: Kirk Ruths, MD Randy Mcmillan Randy Mcmill[place],  [place]andy Mcmill[place] [id number] Chief Complaint  Patient presents with  . Follow-up    1 year follow-up with PSA    HPI: Randy Mcmillan. is a 79 y.o. male who presents today for a 1 year follow up of elevated PSA, BPH with urinary obstruction, and combined arterial insufficiency and corporo-venous occlusive erectile dysfunction.  He has no FHx of prostate cancer. No weight loss or bone pain.   Pt elected to pursue MRI as a first step for further diagnostic evaluation given his age and comorbidities over a phone call on 03/14/2018. On his MRI he had a PI-RADS 4 lesion in which he elected to have a prostate biopsy (standard, non fusion) on 03/30/2018. Prostate bx results showed no evidence of cancer.His TRUS volume is 53 grams.   On 04/14/2018 he was started on finasteride both for his obstructive urinary symptoms as well as diagnostic purposes. His PSA is down trended down to 1.94 after at least 6 months of finasteride  Patient has a longstanding history of erectile dysfunction.  He used Viagra as well as sildenafil in the remote past had not taken this in many years.  He was concerned that the finasteride may be causing erectile dysfunction. Erections were essentially unchanged. Libido was intact.  Today he is doing good. He has nocturia x 3 but this depends on how much he drinks. He has no daytime urinary symptoms. He denies difficulty urinating.   Reports some weight gain.   PSA trend: 3.10 11/13/2019 1.94 10/18/18 (started finasteride) 4.812/11/2017 4.5 08/10/2017 4.69 06/23/2017 3.69 09/18/2015 3.60 09/17/2014 2.587/08/2013   PMH: Past Medical History:  Diagnosis Date  . Anxiety 09/02/2013   On rare 1/2 mg xanax   Last Assessment & Plan:  Doing well on rare xanax  . Chronic kidney disease, stage III (moderate) 09/12/2013    . Essential (primary) hypertension 09/02/2013   Last Assessment & Plan:  Taking medications without noted side effects or dizziness.  . Hyperlipidemia associated with type 2 diabetes mellitus ([place]) 09/02/2013   Last Assessment & Plan:  Diet for healthy cholesterol is being attempted and no clear myalgia's or other side effects are noted.  . Tremor 09/02/2013   Last Assessment & Plan:  Tremor is essentially unchanged.  . Type 2 diabetes mellitus ([place]) 09/02/2013   Last Assessment & Plan:  Has been working on a low fat diet and no myalgia's are present.    Surgical History: No past surgical history on file.  Home Medications:  Allergies as of 11/17/2019   No Known Allergies     Medication List       Accurate as of November 17, 2019 11:59 PM. If you have any questions, ask your nurse or doctor.        ALPRAZolam 0.5 MG tablet Commonly known as: XANAX   aspirin 81 MG chewable tablet Chew by mouth.   atorvastatin 40 MG tablet Commonly known as: LIPITOR Take by mouth.   finasteride 5 MG tablet Commonly known as: PROSCAR Take 1 tablet (5 mg total) by mouth daily.   gabapentin 100 MG capsule Commonly known as: NEURONTIN Take by mouth.   glipiZIDE 5 MG tablet Commonly known as: GLUCOTROL Take by mouth.   Januvia 100 MG tablet Generic drug: sitaGLIPtin   lisinopril 10 MG tablet Commonly known as: ZESTRIL Take by  mouth.   metFORMIN 1000 MG tablet Commonly known as: GLUCOPHAGE Take by mouth.   pioglitazone 30 MG tablet Commonly known as: ACTOS Take 30 mg by mouth daily.   primidone 50 MG tablet Commonly known as: MYSOLINE   sildenafil 20 MG tablet Commonly known as: Revatio Take 1 tablet (20 mg total) by mouth as needed. Take 1-5 tabs as needed prior to intercourse       Allergies: No Known Allergies  Family History: No family history on file.  Social History:  reports that he has never smoked. He has never used smokeless tobacco. He reports current alcohol  use. He reports that he does not use drugs.   Physical Exam: BP (!) 147/81   Pulse 93   Ht 6\' 2"  (1.88 m)   Wt 222 lb (100.7 kg)   BMI 28.50 kg/m   Constitutional:  Alert and oriented, No acute distress. HEENT: [place] AT, moist mucus membranes.  Trachea midline, no masses. Cardiovascular: No clubbing, cyanosis, or edema. Respiratory: Normal respiratory effort, no increased work of breathing.. Skin: No rashes, bruises or suspicious lesions. Neurologic: Grossly intact, no focal deficits, moving all 4 extremities. Psychiatric: Normal mood and affect.   Assessment & Plan:    1. Elevated PSA Present treatment elevated PSA with abnormal prostate MRI lesion, PI-RADS 4 status post negative prostate biopsy (standard non fusion biopsy) PSA has significantly increased to 3.10 ng/dL on 11/13/2019 despite being on finasteride, concerning for prostate cancer   Will plan for a repeat prostate MRI followed by a fusion biopsy in [place], [place].    We discussed prostate biopsy in detail including the procedure itself, the risks of blood in the urine, stool, and ejaculate, serious infection, and discomfort. He is willing to proceed with this as discussed.  Patient understood and agreed.   2. BPH with urinary obstruction Improvement of urinary symptoms currently on finasteride.   F/u after biopsy  [facility] [address], [place], [place] [address] [phone]  I, Randy Mcmillan, am acting as a scribe for Dr. Hollice Mcmillan.  I have reviewed the above documentation for accuracy and completeness, and I agree with the above.   Randy Espy, MD

## 2019-11-17 ENCOUNTER — Ambulatory Visit (INDEPENDENT_AMBULATORY_CARE_PROVIDER_SITE_OTHER): Payer: Medicare Other | Admitting: Urology

## 2019-11-17 ENCOUNTER — Other Ambulatory Visit: Payer: Self-pay

## 2019-11-17 ENCOUNTER — Encounter: Payer: Self-pay | Admitting: Urology

## 2019-11-17 VITALS — BP 147/81 | HR 93 | Ht 74.0 in | Wt 222.0 lb

## 2019-11-17 DIAGNOSIS — N401 Enlarged prostate with lower urinary tract symptoms: Secondary | ICD-10-CM

## 2019-11-17 DIAGNOSIS — N138 Other obstructive and reflux uropathy: Secondary | ICD-10-CM | POA: Diagnosis not present

## 2019-11-17 DIAGNOSIS — R972 Elevated prostate specific antigen [PSA]: Secondary | ICD-10-CM | POA: Diagnosis not present

## 2019-11-24 ENCOUNTER — Other Ambulatory Visit: Payer: Self-pay

## 2019-11-24 ENCOUNTER — Ambulatory Visit: Payer: Medicare Other | Admitting: Urology

## 2019-11-24 ENCOUNTER — Ambulatory Visit
Admission: RE | Admit: 2019-11-24 | Discharge: 2019-11-24 | Disposition: A | Discharge status: Home / Self Care | Payer: Medicare Other | Source: Ambulatory Visit | Attending: Urology | Admitting: Urology

## 2019-11-24 DIAGNOSIS — R972 Elevated prostate specific antigen [PSA]: Secondary | ICD-10-CM | POA: Diagnosis present

## 2019-11-24 DIAGNOSIS — N401 Enlarged prostate with lower urinary tract symptoms: Secondary | ICD-10-CM | POA: Diagnosis present

## 2019-11-24 DIAGNOSIS — N138 Other obstructive and reflux uropathy: Secondary | ICD-10-CM | POA: Diagnosis present

## 2019-11-24 MED ORDER — GADOBUTROL 1 MMOL/ML IV SOLN
10.0000 mL | Freq: Once | INTRAVENOUS | Status: AC | PRN
  Administered 2019-11-24: 10 mL via INTRAVENOUS

## 2019-11-30 ENCOUNTER — Telehealth: Payer: Self-pay | Admitting: Urology

## 2019-11-30 NOTE — Telephone Encounter (Signed)
Pt Randy Mcmillan asking about MRI results from last Friday.  He said Dr Erlene Quan was supposed to call him and he hasn't heard anything.

## 2019-12-01 NOTE — Telephone Encounter (Signed)
Prostate MRI in comparison to previous is significantly improved.  The lesion in question which was previously PI-RADS 4 appears to be most consistent with prostatitis and BPH rather than malignancy.  This prostate MRI was read as PI-RADS 2 which is reassuring.  We did originally discussed proceeding with fusion biopsy but given the improvement in the imaging, will hold off for the time being.  We will have him return in 6 months with a PSA prior  He is agreeable this plan.  Please schedule.  Hollice Espy, MD

## 2019-12-01 NOTE — Telephone Encounter (Signed)
Pt called office again today asking about MRI results.

## 2020-05-28 ENCOUNTER — Other Ambulatory Visit: Payer: Self-pay

## 2020-05-28 DIAGNOSIS — R972 Elevated prostate specific antigen [PSA]: Secondary | ICD-10-CM

## 2020-06-03 ENCOUNTER — Other Ambulatory Visit
Admission: RE | Admit: 2020-06-03 | Discharge: 2020-06-03 | Disposition: A | Discharge status: Home / Self Care | Payer: Medicare Other | Attending: Urology | Admitting: Urology

## 2020-06-03 ENCOUNTER — Other Ambulatory Visit: Payer: Self-pay

## 2020-06-03 ENCOUNTER — Encounter: Payer: Self-pay | Admitting: Urology

## 2020-06-03 ENCOUNTER — Other Ambulatory Visit: Payer: Medicare Other

## 2020-06-03 DIAGNOSIS — R972 Elevated prostate specific antigen [PSA]: Secondary | ICD-10-CM | POA: Diagnosis present

## 2020-06-03 LAB — PSA: Prostatic Specific Antigen: 3.54 ng/mL (ref 0.00–4.00)

## 2020-06-03 NOTE — Addendum Note (Signed)
Addended by: Milbert Coulter on: 06/03/2020 08:57 AM   Modules accepted: Orders

## 2020-06-10 NOTE — Progress Notes (Signed)
06/11/2020 4:32 PM   Randy Mcmillan 10/08/1940 [id number]  Referring provider: Kirk Ruths, MD [facility] [place],  [place] [address]  Chief Complaint  Patient presents with  . Elevated PSA    HPI: 80 yo M with history of BPH/elevated PSA returns today for 59-month follow-up.  Pt elected to pursue MRI as a first step for further diagnostic evaluation given his age and comorbidities over a phone call on 03/14/2018. On his MRI he had a PI-RADS 4 lesion in which he elected to have a prostate biopsy (standard, non fusion) on 03/30/2018. Prostate bx results showed no evidence of cancer.His TRUS volume is 53 grams.  On 04/14/2018 he was started on finasteride both for his obstructive urinary symptoms as well as diagnostic purposes. His PSA is down trended down to 1.94 after at least 6 months of finasteride.  Shortly thereafter, his PSA rose to 3.1 on finasteride.  He underwent a repeat prostate MRI which showed a reduction in his prostate volume to 37 cc.  His PI-RADS lesions were also downgraded to PI-RADS 2 with resolution of another area in the anterior right apex.  As this was reassuring, he elected continued observation.  Today, he mentions he has nocturia x 2.  Minimal day time symptoms.     PSA trend: 3.54 06/03/2020 3.10 11/13/2019 1.94 10/18/18 (started finasteride) 4.812/11/2017 4.5 08/10/2017 4.69 06/23/2017 3.69 09/18/2015 3.60 09/17/2014 2.587/08/2013   PMH: Past Medical History:  Diagnosis Date  . Anxiety 09/02/2013   On rare 1/2 mg xanax   Last Assessment & Plan:  Doing well on rare xanax  . Chronic kidney disease, stage III (moderate) ([place]) 09/12/2013  . Essential (primary) hypertension 09/02/2013   Last Assessment & Plan:  Taking medications without noted side effects or dizziness.  . Hyperlipidemia associated with type 2 diabetes mellitus ([place]) 09/02/2013   Last Assessment & Plan:  Diet for healthy cholesterol is being  attempted and no clear myalgia's or other side effects are noted.  . Tremor 09/02/2013   Last Assessment & Plan:  Tremor is essentially unchanged.  . Type 2 diabetes mellitus ([place]) 09/02/2013   Last Assessment & Plan:  Has been working on a low fat diet and no myalgia's are present.    Surgical History: No past surgical history on file.  Home Medications:  Allergies as of 06/11/2020   No Known Allergies     Medication List       Accurate as of June 11, 2020 11:59 PM. If you have any questions, ask your nurse or doctor.        STOP taking these medications   sildenafil 20 MG tablet Commonly known as: Revatio Stopped by: Hollice Espy, MD     TAKE these medications   ALPRAZolam 0.5 MG tablet Commonly known as: XANAX   aspirin 81 MG chewable tablet Chew by mouth.   atorvastatin 40 MG tablet Commonly known as: LIPITOR Take by mouth.   finasteride 5 MG tablet Commonly known as: PROSCAR Take 1 tablet (5 mg total) by mouth daily.   gabapentin 100 MG capsule Commonly known as: NEURONTIN Take by mouth.   glipiZIDE 5 MG tablet Commonly known as: GLUCOTROL Take by mouth.   Januvia 100 MG tablet Generic drug: sitaGLIPtin   lisinopril 10 MG tablet Commonly known as: ZESTRIL Take by mouth.   meloxicam 15 MG tablet Commonly known as: MOBIC Take 1 tablet by mouth daily.   metFORMIN 1000 MG  tablet Commonly known as: GLUCOPHAGE Take by mouth.   pioglitazone 30 MG tablet Commonly known as: ACTOS Take 30 mg by mouth daily.   primidone 50 MG tablet Commonly known as: MYSOLINE       Allergies: No Known Allergies  Family History: No family history on file.  Social History:  reports that he has never smoked. He has never used smokeless tobacco. He reports current alcohol use. He reports that he does not use drugs.   Physical Exam: BP 137/75   Pulse (!) 102   Ht 6\' 2"  (1.88 m)   Wt 224 lb (101.6 kg)   BMI 28.76 kg/m   Constitutional:  Alert and oriented,  No acute distress. HEENT: [place] AT, moist mucus membranes.  Trachea midline, no masses. Cardiovascular: No clubbing, cyanosis, or edema. Respiratory: Normal respiratory effort, no increased work of breathing. Rectal:  Normal sphincter tone, no masses or lesion, nontender, 40 g Skin: No rashes, bruises or suspicious lesions. Neurologic: Grossly intact, no focal deficits, moving all 4 extremities. Psychiatric: Normal mood and affect.   Assessment & Plan:    1. Elevated PSA Personal history of elevated PSA s/p negative biopsy x 1 and prostate MRI x 2  PSA continues to trend upwards despite being on finasteride which is somewhat concerning although prostate MRI 6 months ago was actually improved, given his age and comorbidities, he is agreeable for continued surveillance for now, repeat PSA in 6 months.  If it continues to rise upwards, he will likely be willing to consider repeat biopsy with or without repeat MRI prior - PSA; Future  2. Nocturia Improving, reviewed behavioral modification  Continue finasteride for the time being   Follow-up in 6 months with PSA prior  Hollice Espy, MD  [facility] [address], [place], [place] [address] [phone]

## 2020-06-11 ENCOUNTER — Ambulatory Visit: Payer: Medicare Other | Admitting: Urology

## 2020-06-11 ENCOUNTER — Other Ambulatory Visit: Payer: Self-pay

## 2020-06-11 ENCOUNTER — Ambulatory Visit (INDEPENDENT_AMBULATORY_CARE_PROVIDER_SITE_OTHER): Payer: Medicare Other | Admitting: Urology

## 2020-06-11 ENCOUNTER — Encounter: Payer: Self-pay | Admitting: Urology

## 2020-06-11 VITALS — BP 137/75 | HR 102 | Ht 74.0 in | Wt 224.0 lb

## 2020-06-11 DIAGNOSIS — R351 Nocturia: Secondary | ICD-10-CM | POA: Diagnosis not present

## 2020-06-11 DIAGNOSIS — R972 Elevated prostate specific antigen [PSA]: Secondary | ICD-10-CM | POA: Diagnosis not present

## 2020-07-02 ENCOUNTER — Other Ambulatory Visit: Payer: Self-pay | Admitting: *Deleted

## 2020-07-02 MED ORDER — FINASTERIDE 5 MG PO TABS: 5.0000 mg | ORAL_TABLET | Freq: Every day | ORAL | 3 refills | Status: DC

## 2020-12-11 ENCOUNTER — Other Ambulatory Visit: Payer: Medicare Other

## 2020-12-11 ENCOUNTER — Other Ambulatory Visit: Payer: Self-pay

## 2020-12-11 DIAGNOSIS — R972 Elevated prostate specific antigen [PSA]: Secondary | ICD-10-CM

## 2020-12-12 LAB — PSA: Prostate Specific Ag, Serum: 5.5 ng/mL — ABNORMAL HIGH (ref 0.0–4.0)

## 2020-12-16 NOTE — Progress Notes (Signed)
12/17/20 11:25 AM   Randy Mcmillan 21-Apr-1940 [id number]  Referring provider:  Kirk Ruths, MD [facility] [place],  [place] [address] Chief Complaint  Patient presents with   Elevated PSA     HPI: Randy Mcmillan. is a 80 y.o.male with a personal history of BPH and elevated PSA, who presents today for 6 month follow-up with PSA.   Pt elected to pursue MRI as a first step for further diagnostic evaluation given his age and comorbidities over a phone call on 03/14/2018. On his MRI he had a PI-RADS 4 lesion in which he elected to have a prostate biopsy (standard, non fusion) on 03/30/2018. Prostate bx results showed no evidence of cancer. His TRUS volume is 53 grams.    He was started on finasteride on 04/14/2018 and his PSA trended down. Shortly after his PSA had risin. He underwent repeat prostate MR that revealed a reduction in his prostate volume to 37 cc. He elected to pursue MRI as a first step for further diagnostic evaluation given his age and comorbidities over a phone call on 03/14/2018. On his MRI he had a PI-RADS 4 lesion in which he elected to have a prostate biopsy (standard, non fusion) on 03/30/2018. Prostate bx results showed no evidence of cancer. His TRUS volume is 53 grams.    His most recent PSA on 12/11/2020 was 5.5.   He is doing well today. He has questions about the PSA test, all of his question were answered. He reports that he urinated 1-2 times daily and 3-4 times a night with no trouble with stream/flow.   PSA trend:  5.5 12/11/2020 3.54 06/03/2020 3.10 11/13/2019 1.94 10/18/18 (started finasteride) 4.8 02/08/2018 4.5 08/10/2017 4.69 06/23/2017 3.69 09/18/2015 3.60 09/17/2014 2.58 09/05/2013   PMH: Past Medical History:  Diagnosis Date   Anxiety 09/02/2013   On rare 1/2 mg xanax   Last Assessment & Plan:  Doing well on rare xanax   Chronic kidney disease, stage III (moderate) ([place]) 09/12/2013   Essential  (primary) hypertension 09/02/2013   Last Assessment & Plan:  Taking medications without noted side effects or dizziness.   Hyperlipidemia associated with type 2 diabetes mellitus ([place]) 09/02/2013   Last Assessment & Plan:  Diet for healthy cholesterol is being attempted and no clear myalgia's or other side effects are noted.   Tremor 09/02/2013   Last Assessment & Plan:  Tremor is essentially unchanged.   Type 2 diabetes mellitus ([place]) 09/02/2013   Last Assessment & Plan:  Has been working on a low fat diet and no myalgia's are present.    Surgical History: No past surgical history on file.  Home Medications:  Allergies as of 12/17/2020   No Known Allergies      Medication List        Accurate as of December 17, 2020 11:25 AM. If you have any questions, ask your nurse or doctor.          STOP taking these medications    ALPRAZolam 0.5 MG tablet Commonly known as: Duanne Moron Stopped by: Hollice Espy, MD   gabapentin 100 MG capsule Commonly known as: NEURONTIN Stopped by: Hollice Espy, MD   meloxicam 15 MG tablet Commonly known as: MOBIC Stopped by: Hollice Espy, MD       TAKE these medications    aspirin 81 MG chewable tablet Chew by mouth.   atorvastatin 40 MG tablet Commonly known as: LIPITOR Take by  mouth.   finasteride 5 MG tablet Commonly known as: PROSCAR Take 1 tablet (5 mg total) by mouth daily.   glipiZIDE 5 MG tablet Commonly known as: GLUCOTROL Take by mouth.   Januvia 100 MG tablet Generic drug: sitaGLIPtin   lisinopril 10 MG tablet Commonly known as: ZESTRIL Take by mouth.   metFORMIN 1000 MG tablet Commonly known as: GLUCOPHAGE Take by mouth.   pioglitazone 30 MG tablet Commonly known as: ACTOS Take 30 mg by mouth daily.   primidone 50 MG tablet Commonly known as: MYSOLINE        Allergies: No Known Allergies  Family History: No family history on file.  Social History:  reports that he has never smoked. He has never used  smokeless tobacco. He reports current alcohol use. He reports that he does not use drugs.   Physical Exam: BP 134/82   Pulse 78   Ht 6\' 2"  (1.88 m)   Wt 224 lb (101.6 kg)   BMI 28.76 kg/m   Constitutional:  Alert and oriented, No acute distress. HEENT: [place] AT, moist mucus membranes.  Trachea midline, no masses. Cardiovascular: No clubbing, cyanosis, or edema. Respiratory: Normal respiratory effort, no increased work of breathing. Skin: No rashes, bruises or suspicious lesions. Neurologic: Grossly intact, no focal deficits, moving all 4 extremities. Psychiatric: Normal mood and affect.  Laboratory Data:  Lab Results  Component Value Date   CREATININE 1.30 (H) 03/11/2018    Assessment & Plan:    Elevated PSA -PSA is elevated and rising, overall PSA velocity is concerning  -Discussed repeat biopsy, repeat MRI, other risk stratification tools  - He is agreeable to getting a prostate MRI and a fusion biopsy; risks and benefits reviewed again in detail   Follow-up with prostate MRI results   I,Kailey Littlejohn,acting as a scribe for Hollice Espy, MD.,have documented all relevant documentation on the behalf of Hollice Espy, MD,as directed by  Hollice Espy, MD while in the presence of Hollice Espy, MD.  I have reviewed the above documentation for accuracy and completeness, and I agree with the above.   Hollice Espy, MD  [facility] [address], [place], [place] [address] [phone]

## 2020-12-17 ENCOUNTER — Encounter: Payer: Self-pay | Admitting: Urology

## 2020-12-17 ENCOUNTER — Ambulatory Visit (INDEPENDENT_AMBULATORY_CARE_PROVIDER_SITE_OTHER): Payer: Medicare Other | Admitting: Urology

## 2020-12-17 ENCOUNTER — Other Ambulatory Visit: Payer: Self-pay

## 2020-12-17 VITALS — BP 134/82 | HR 78 | Ht 74.0 in | Wt 224.0 lb

## 2020-12-17 DIAGNOSIS — R972 Elevated prostate specific antigen [PSA]: Secondary | ICD-10-CM | POA: Diagnosis not present

## 2020-12-17 NOTE — Patient Instructions (Signed)

## 2021-01-02 ENCOUNTER — Ambulatory Visit
Admission: RE | Admit: 2021-01-02 | Discharge: 2021-01-02 | Disposition: A | Discharge status: Home / Self Care | Payer: Medicare Other | Source: Ambulatory Visit | Attending: Urology | Admitting: Urology

## 2021-01-02 ENCOUNTER — Other Ambulatory Visit: Payer: Self-pay

## 2021-01-02 DIAGNOSIS — R972 Elevated prostate specific antigen [PSA]: Secondary | ICD-10-CM | POA: Diagnosis present

## 2021-01-02 MED ORDER — GADOBUTROL 1 MMOL/ML IV SOLN
10.0000 mL | Freq: Once | INTRAVENOUS | Status: AC | PRN
  Administered 2021-01-02: 10 mL via INTRAVENOUS

## 2021-01-03 ENCOUNTER — Telehealth: Payer: Self-pay | Admitting: *Deleted

## 2021-01-03 DIAGNOSIS — R972 Elevated prostate specific antigen [PSA]: Secondary | ICD-10-CM

## 2021-01-03 NOTE — Telephone Encounter (Addendum)
Patient informed, voiced understanding.   ----- Message from Hollice Espy, MD sent at 01/03/2021 10:19 AM EDT ----- Prostate MRI looks worse than his previous MRI.  At this point, I strongly feel that he needs a formal fusion biopsy in [place].  If he is amenable to this, please arrange and have him follow-up with me thereafter.  This was the action plan that we had discussed in clinic at our last visit.  Hollice Espy, MD

## 2021-01-03 NOTE — Addendum Note (Signed)
Addended by: Verlene Mayer A on: 01/03/2021 01:58 PM   Modules accepted: Orders

## 2021-01-20 ENCOUNTER — Other Ambulatory Visit: Payer: Self-pay | Admitting: Urology

## 2021-01-21 NOTE — Progress Notes (Signed)
01/24/21 9:24 AM   [name] 02-02-1941 [id number]  Referring provider:  Kirk Ruths, MD [facility] [place],  [place] [address] No chief complaint on file.    HPI: Randy Mcmillan. is a 80 y.o.male he has a personal history of BPH and elevated PSA, who presents today for fusion biopsy results.   He was started on finasteride on 04/14/2018 and his PSA trended down. Shortly after his PSA had risin. He underwent repeat prostate MR that revealed a reduction in his prostate volume to 37 cc. He elected to pursue MRI as a first step for further diagnostic evaluation given his age and comorbidities over a phone call on 03/14/2018. On his MRI he had a PI-RADS 4 lesion in which he elected to have a prostate biopsy (standard, non fusion) on 03/30/2018. Prostate bx results showed no evidence of cancer. His TRUS volume is 53 grams.   Repeat fusion biopsy on 11/22 showed possible transcapsular spready at left apex, PIRADS 5 with negative nodes, negative bone mets, etc.    He elected to undergo repeat fusion bx as below and returns today to discuss these results.    iPSA 5.5, rectal exam unremarkable  01/20/2021 fusion biopsy revealed Gleason 3+3=6, Gleason 3+4=7, Gleason 4+3=7, Gleason 4+4=8    He is doing well today.       PMH: Past Medical History:  Diagnosis Date   Anxiety 09/02/2013   On rare 1/2 mg xanax   Last Assessment & Plan:  Doing well on rare xanax   Chronic kidney disease, stage III (moderate) ([place]) 09/12/2013   Essential (primary) hypertension 09/02/2013   Last Assessment & Plan:  Taking medications without noted side effects or dizziness.   Hyperlipidemia associated with type 2 diabetes mellitus ([place]) 09/02/2013   Last Assessment & Plan:  Diet for healthy cholesterol is being attempted and no clear myalgia's or other side effects are noted.   Tremor 09/02/2013   Last Assessment & Plan:  Tremor is essentially unchanged.   Type  2 diabetes mellitus ([place]) 09/02/2013   Last Assessment & Plan:  Has been working on a low fat diet and no myalgia's are present.    Surgical History: No past surgical history on file.  Home Medications:  Allergies as of 01/22/2021   No Known Allergies      Medication List        Accurate as of January 22, 2021 11:59 PM. If you have any questions, ask your nurse or doctor.          aspirin 81 MG chewable tablet Chew by mouth.   atorvastatin 40 MG tablet Commonly known as: LIPITOR Take by mouth.   finasteride 5 MG tablet Commonly known as: PROSCAR Take 1 tablet (5 mg total) by mouth daily.   glipiZIDE 5 MG tablet Commonly known as: GLUCOTROL Take by mouth.   Januvia 100 MG tablet Generic drug: sitaGLIPtin   lisinopril 10 MG tablet Commonly known as: ZESTRIL Take by mouth.   metFORMIN 1000 MG tablet Commonly known as: GLUCOPHAGE Take by mouth.   pioglitazone 30 MG tablet Commonly known as: ACTOS Take 30 mg by mouth daily.   primidone 50 MG tablet Commonly known as: MYSOLINE        Allergies: No Known Allergies  Family History: No family history on file.  Social History:  reports that he has never smoked. He has never used smokeless tobacco. He reports current alcohol use.  He reports that he does not use drugs.   Physical Exam: BP (!) 147/82   Pulse (!) 106   Ht 6\' 2"  (1.88 m)   Wt 224 lb (101.6 kg)   BMI 28.76 kg/m   Constitutional:  Alert and oriented, No acute distress. HEENT: [place] AT, moist mucus membranes.  Trachea midline, no masses. Cardiovascular: No clubbing, cyanosis, or edema. Respiratory: Normal respiratory effort, no increased work of breathing. Skin: No rashes, bruises or suspicious lesions. Neurologic: Grossly intact, no focal deficits, moving all 4 extremities. Psychiatric: Normal mood and affect.  Laboratory Data:  Lab Results  Component Value Date   CREATININE 1.30 (H) 03/11/2018     Assessment & Plan:   Prostate  cancer - High risk  - Recommend radiation + ADT 2-3 years due to comorbidity   - The patient was counseled about the natural history of prostate cancer and the standard treatment options that are available for prostate cancer. It was explained to him how his age and life expectancy, clinical stage, Gleason score, and PSA affect his prognosis, the decision to proceed with additional staging studies, as well as how that information influences recommended treatment strategies. We discussed the roles for active surveillance, radiation therapy, surgical therapy, androgen deprivation, as well as ablative therapy options for the treatment of prostate cancer as appropriate to his individual cancer situation. We discussed the risks and benefits of these options with regard to their impact on cancer control and also in terms of potential adverse events, complications, and impact on quality of life particularly related to urinary, bowel, and sexual function. The patient was encouraged to ask questions throughout the discussion today and all questions were answered to his stated satisfaction. In addition, the patient was provided with and/or directed to appropriate resources and literature for further education about prostate cancer treatment options.  - CT abdomen/pelvis staging recommended; scheduled, given low PSA will hold off on bone scan  - Referral sent to radiology oncology   - Discussed ADT along with risk including hot flashes, weight gain, loss of muscle mass, long-term bone and cardiovascular risk discussed.  He was provided with literature about this medication.   F/u ADT  Conley Rolls as a scribe for Hollice Espy, MD.,have documented all relevant documentation on the behalf of Hollice Espy, MD,as directed by  Hollice Espy, MD while in the presence of Hollice Espy, MD.  I have reviewed the above documentation for accuracy and completeness, and I agree with the above.   Hollice Espy, MD   [facility] [address] [place], [place] [address] [phone]  I spent 40 total minutes on the day of the encounter including pre-visit review of the medical record, face-to-face time with the patient, and post visit ordering of labs/imaging/tests.

## 2021-01-22 ENCOUNTER — Ambulatory Visit (INDEPENDENT_AMBULATORY_CARE_PROVIDER_SITE_OTHER): Payer: Medicare Other | Admitting: Urology

## 2021-01-22 ENCOUNTER — Other Ambulatory Visit: Payer: Self-pay

## 2021-01-22 ENCOUNTER — Encounter: Payer: Self-pay | Admitting: Urology

## 2021-01-22 VITALS — BP 147/82 | HR 106 | Ht 74.0 in | Wt 224.0 lb

## 2021-01-22 DIAGNOSIS — C61 Malignant neoplasm of prostate: Secondary | ICD-10-CM | POA: Diagnosis not present

## 2021-01-30 ENCOUNTER — Other Ambulatory Visit: Payer: Self-pay

## 2021-01-30 ENCOUNTER — Ambulatory Visit
Admission: RE | Admit: 2021-01-30 | Discharge: 2021-01-30 | Disposition: A | Discharge status: Home / Self Care | Payer: Medicare Other | Source: Ambulatory Visit | Attending: Radiation Oncology | Admitting: Radiation Oncology

## 2021-01-30 ENCOUNTER — Encounter: Payer: Self-pay | Admitting: Radiation Oncology

## 2021-01-30 VITALS — BP 136/70 | HR 82 | Temp 97.4°F | Wt 222.9 lb

## 2021-01-30 DIAGNOSIS — K5900 Constipation, unspecified: Secondary | ICD-10-CM | POA: Insufficient documentation

## 2021-01-30 DIAGNOSIS — N183 Chronic kidney disease, stage 3 unspecified: Secondary | ICD-10-CM | POA: Diagnosis not present

## 2021-01-30 DIAGNOSIS — Z79899 Other long term (current) drug therapy: Secondary | ICD-10-CM | POA: Diagnosis not present

## 2021-01-30 DIAGNOSIS — C61 Malignant neoplasm of prostate: Secondary | ICD-10-CM | POA: Diagnosis present

## 2021-01-30 DIAGNOSIS — E089 Diabetes mellitus due to underlying condition without complications: Secondary | ICD-10-CM | POA: Diagnosis not present

## 2021-01-30 DIAGNOSIS — Z7984 Long term (current) use of oral hypoglycemic drugs: Secondary | ICD-10-CM | POA: Insufficient documentation

## 2021-01-30 DIAGNOSIS — I129 Hypertensive chronic kidney disease with stage 1 through stage 4 chronic kidney disease, or unspecified chronic kidney disease: Secondary | ICD-10-CM | POA: Insufficient documentation

## 2021-01-30 DIAGNOSIS — R351 Nocturia: Secondary | ICD-10-CM | POA: Diagnosis not present

## 2021-01-30 DIAGNOSIS — E785 Hyperlipidemia, unspecified: Secondary | ICD-10-CM | POA: Insufficient documentation

## 2021-01-30 NOTE — Consult Note (Signed)
NEW PATIENT EVALUATION  Name: Randy Mcmillan.  MRN: [id number]  Date:   01/30/2021     DOB: 16-May-1940   This 80 y.o. 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  Of 5.5  REFERRING PHYSICIAN: Kirk Ruths, MD  CHIEF COMPLAINT:  Chief Complaint  Patient presents with   Prostate Cancer    DIAGNOSIS: The encounter diagnosis was Malignant neoplasm of prostate ([place]).   PREVIOUS INVESTIGATIONS:  CT scan to be performed next week.  MRI scan reviewed Pathology report reviewed Clinical notes reviewed  HPI: Patient is a 80 year old male who was noted over the past year to have rising PSA up to 5.5.  He underwent an MRI scan of his prostate showing area of low T2 signal corresponding to restricted diffusion in the anterior transitional and peripheral zone of the apical gland the PI-RADS category 5 lesion.  There is potential transcapsular spread along the prostate apex.  Patient underwent fusion biopsy showing 4 of 12 biopsies positive for mostly Gleason 7 (4+3) although there was a Gleason 8 (4+4) lesion.  Patient has a CT scan of his abdomen pelvis scheduled for next week.  He is has some nocturia x4 no frequency and urgency has been on finasteride which I have asked him to discontinue.  His bowels tends towards constipation.  He is having no bone pain at this time.  He is now referred to [facility] for opinion.  PLANNED TREATMENT REGIMEN: Image guided IMRT radiation  PAST MEDICAL HISTORY:  has a past medical history of Anxiety (09/02/2013), Chronic kidney disease, stage III (moderate) ([place]) (09/12/2013), Essential (primary) hypertension (09/02/2013), Hyperlipidemia associated with type 2 diabetes mellitus ([place]) (09/02/2013), Tremor (09/02/2013), and Type 2 diabetes mellitus ([place]) (09/02/2013).    PAST SURGICAL HISTORY: No past surgical history on file.  FAMILY HISTORY:  family history is not on file.  SOCIAL HISTORY:  reports that he has never smoked. He has never used smokeless tobacco. He reports current alcohol use. He reports that he does not use drugs.  ALLERGIES: Patient has no known allergies.  MEDICATIONS:  Current Outpatient Medications  Medication Sig Dispense Refill   aspirin 81 MG chewable tablet Chew by mouth.     atorvastatin (LIPITOR) 40 MG tablet Take by mouth.     finasteride (PROSCAR) 5 MG tablet Take 1 tablet (5 mg total) by mouth daily. 90 tablet 3   glipiZIDE (GLUCOTROL) 5 MG tablet Take by mouth.     JANUVIA 100 MG tablet      lisinopril (PRINIVIL,ZESTRIL) 10 MG tablet Take by mouth.     metFORMIN (GLUCOPHAGE) 1000 MG tablet Take by mouth.     pioglitazone (ACTOS) 30 MG tablet Take 30 mg by mouth daily.     primidone (MYSOLINE) 50 MG tablet      No current facility-administered medications for this encounter.    ECOG PERFORMANCE STATUS:  0 - Asymptomatic  REVIEW OF SYSTEMS: Patient denies any weight loss, fatigue, weakness, fever, chills or night sweats. Patient denies any loss of vision, blurred vision. Patient denies any ringing  of the ears or hearing loss. No irregular heartbeat. Patient denies heart murmur or history of fainting. Patient denies any chest pain or pain radiating to her upper extremities. Patient denies any shortness of breath, difficulty breathing at night, cough or hemoptysis. Patient denies any swelling in the lower legs. Patient denies any nausea  vomiting, vomiting of blood, or coffee ground material in the vomitus. Patient denies any stomach pain. Patient states has had normal bowel movements no significant constipation or diarrhea. Patient denies any dysuria, hematuria or significant nocturia. Patient denies any problems walking, swelling in the joints or loss of balance. Patient denies any skin changes, loss of hair or loss of weight. Patient denies any excessive worrying or anxiety or significant depression.  Patient denies any problems with insomnia. Patient denies excessive thirst, polyuria, polydipsia. Patient denies any swollen glands, patient denies easy bruising or easy bleeding. Patient denies any recent infections, allergies or URI. Patient "s visual fields have not changed significantly in recent time.   PHYSICAL EXAM: BP 136/70   Pulse 82   Temp (!) 97.4 F (36.3 C) (Tympanic)   Wt 222 lb 14.4 oz (101.1 kg)   BMI 28.62 kg/m  Well-developed well-nourished patient in NAD. HEENT reveals PERLA, EOMI, discs not visualized.  Oral cavity is clear. No oral mucosal lesions are identified. Neck is clear without evidence of cervical or supraclavicular adenopathy. Lungs are clear to A&P. Cardiac examination is essentially unremarkable with regular rate and rhythm without murmur rub or thrill. Abdomen is benign with no organomegaly or masses noted. Motor sensory and DTR levels are equal and symmetric in the upper and lower extremities. Cranial nerves II through XII are grossly intact. Proprioception is intact. No peripheral adenopathy or edema is identified. No motor or sensory levels are noted. Crude visual fields are within normal range.  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  I have recommended a hypofractionated course of 70 Gray over 28 fractions at 250 cGy per fraction to his prostate.  Risks and benefits of treatment including increased lower urinary tract symptoms diarrhea fatigue alteration of blood counts and skin reaction all reviewed with the patient.  I have also asked the patient to start Eligard and I have  referred him back to Dr. Erlene Quan for both fiducial marker placement as well as starting androgen deprivation therapy.  Patient rec comprehends my recommendations well.  I would like to take this opportunity to thank you for allowing me to participate in the care of your patient.Noreene Filbert, MD

## 2021-02-05 ENCOUNTER — Other Ambulatory Visit: Payer: Self-pay

## 2021-02-05 ENCOUNTER — Ambulatory Visit
Admission: RE | Admit: 2021-02-05 | Discharge: 2021-02-05 | Disposition: A | Discharge status: Home / Self Care | Payer: Medicare Other | Source: Ambulatory Visit | Attending: Urology | Admitting: Urology

## 2021-02-05 DIAGNOSIS — C61 Malignant neoplasm of prostate: Secondary | ICD-10-CM | POA: Diagnosis present

## 2021-02-05 LAB — POCT I-STAT CREATININE: Creatinine, Ser: 1.3 mg/dL — ABNORMAL HIGH (ref 0.61–1.24)

## 2021-02-05 MED ORDER — IOHEXOL 300 MG/ML  SOLN
80.0000 mL | Freq: Once | INTRAMUSCULAR | Status: AC | PRN
  Administered 2021-02-05: 80 mL via INTRAVENOUS

## 2021-02-05 NOTE — Progress Notes (Signed)
   02/06/21 CC:  Chief Complaint  Patient presents with   Elevated PSA    HPI: Randy Mcmillan. is a 80 y.o.male with a personal history of prostate cancer and BPH, who presents today for gold seed placement and one time dose of Eligard.   He was started on finasteride on 04/14/2018 and his PSA trended down. Shortly after his PSA had risen. He underwent repeat prostate MR that revealed a reduction in his prostate volume to 37 cc. He elected to pursue MRI as a first step for further diagnostic evaluation given his age and comorbidities over a phone call on 03/14/2018. On his MRI he had a PI-RADS 4 lesion in which he elected to have a prostate biopsy (standard, non fusion) on 03/30/2018. Prostate bx results showed no evidence of cancer. His TRUS volume is 53 grams.     01/20/2021 repeat fusion biopsy revealed Gleason 3+3=6, Gleason 3+4=7, Gleason 4+3=7, Gleason 4+4=8.   He is elected he EBRT plus ADT x2 to 3 years  CT abdomen pelvis is still pending radiology interpretation however this was personally reviewed today and appears to be negative.  He will be receiving ADT today.  All additional questions about this were answered.    Vitals:   02/06/21 0952  BP: (!) 149/90  NED. A&Ox3.   No respiratory distress   Abd soft, NT, ND Normal external genitalia with patent urethral meatus  Prostate Gold Seed Marker Placement Procedure   Informed consent was obtained after discussing risks/benefits of the procedure.  A time out was performed to ensure correct patient identity.  Pre-Procedure: - Gentamicin given prophylactically - PO Levaquin 500 mg also given today  Procedure: -Lidocaine jelly was administered per rectum -Rectal ultrasound probe was placed without difficulty and the prostate visualized - 3 fiducial gold seed markers placed, one at right base, one at left base, one at apex of prostate gland under transrectal ultrasound guidance  Post-Procedure: - Patient tolerated the  procedure well - He was counseled to seek immediate medical attention if experiences any severe pain, significant bleeding, or fevers  Guy Sandifer Joell Buerger,acting as a scribe for Hollice Espy, MD.,have documented all relevant documentation on the behalf of Hollice Espy, MD,as directed by  Hollice Espy, MD while in the presence of Hollice Espy, MD.  I have reviewed the above documentation for accuracy and completeness, and I agree with the above.   Hollice Espy, MD

## 2021-02-06 ENCOUNTER — Ambulatory Visit (INDEPENDENT_AMBULATORY_CARE_PROVIDER_SITE_OTHER): Payer: Medicare Other | Admitting: Urology

## 2021-02-06 ENCOUNTER — Encounter: Payer: Self-pay | Admitting: Urology

## 2021-02-06 VITALS — BP 149/90 | Ht 74.0 in | Wt 229.0 lb

## 2021-02-06 DIAGNOSIS — C61 Malignant neoplasm of prostate: Secondary | ICD-10-CM | POA: Diagnosis not present

## 2021-02-06 MED ORDER — GENTAMICIN SULFATE 40 MG/ML IJ SOLN
80.0000 mg | Freq: Once | INTRAMUSCULAR | Status: AC
  Administered 2021-02-06: 80 mg via INTRAMUSCULAR

## 2021-02-06 MED ORDER — LEUPROLIDE ACETATE (6 MONTH) 45 MG ~~LOC~~ KIT
45.0000 mg | PACK | Freq: Once | SUBCUTANEOUS | Status: AC
  Administered 2021-02-06: 45 mg via SUBCUTANEOUS

## 2021-02-06 MED ORDER — LEVOFLOXACIN 500 MG PO TABS
500.0000 mg | ORAL_TABLET | Freq: Once | ORAL | Status: AC
  Administered 2021-02-06: 500 mg via ORAL

## 2021-02-06 NOTE — Patient Instructions (Signed)
Take Vitamin D 800-1000iu and Calium 1000-1200mg  daily while on Eligard

## 2021-02-06 NOTE — Progress Notes (Addendum)
Eligard SubQ Injection   Due to Prostate Cancer patient is present today for a Eligard Injection.  Medication: Eligard 6 month Dose: 45 mg  Location: lower right abdomen Lot: [id number] Exp: 3/24  Patient tolerated well, no complications were noted  Performed by: Verlene Mayer, [place]  Per Dr. Erlene Quan patient is to continue therapy for 2 years. Patient's next follow up was scheduled for 08/07/21. This appointment was scheduled using wheel and given to patient today along with reminder continue on Vitamin D 800-1000iu and Calium 1000-1200mg  daily while on Androgen Deprivation Therapy.

## 2021-02-10 ENCOUNTER — Telehealth: Payer: Self-pay | Admitting: *Deleted

## 2021-02-10 NOTE — Telephone Encounter (Addendum)
Patient informed, voiced understanding. Had concerns regarding taking calcium while on Androgen Deprivation Therapy causing gallstones to worsen seen on CT, denies any pain.   ----- Message from Hollice Espy, MD sent at 02/10/2021  2:15 PM EST ----- Results from CT scan are in fact favorable, no concerning findings.  Hollice Espy, MD

## 2021-02-14 ENCOUNTER — Ambulatory Visit
Admission: RE | Admit: 2021-02-14 | Discharge: 2021-02-14 | Disposition: A | Discharge status: Home / Self Care | Payer: Medicare Other | Source: Ambulatory Visit | Attending: Radiation Oncology | Admitting: Radiation Oncology

## 2021-02-14 DIAGNOSIS — C61 Malignant neoplasm of prostate: Secondary | ICD-10-CM | POA: Diagnosis present

## 2021-02-14 DIAGNOSIS — Z510 Encounter for antineoplastic radiation therapy: Secondary | ICD-10-CM | POA: Diagnosis not present

## 2021-02-19 DIAGNOSIS — Z510 Encounter for antineoplastic radiation therapy: Secondary | ICD-10-CM | POA: Diagnosis not present

## 2021-02-20 ENCOUNTER — Other Ambulatory Visit: Payer: Self-pay | Admitting: *Deleted

## 2021-02-20 DIAGNOSIS — C61 Malignant neoplasm of prostate: Secondary | ICD-10-CM

## 2021-02-25 ENCOUNTER — Ambulatory Visit: Admission: RE | Admit: 2021-02-25 | Payer: Medicare Other | Source: Ambulatory Visit

## 2021-02-26 ENCOUNTER — Ambulatory Visit
Admission: RE | Admit: 2021-02-26 | Discharge: 2021-02-26 | Disposition: A | Discharge status: Home / Self Care | Payer: Medicare Other | Source: Ambulatory Visit | Attending: Radiation Oncology | Admitting: Radiation Oncology

## 2021-02-26 DIAGNOSIS — Z510 Encounter for antineoplastic radiation therapy: Secondary | ICD-10-CM | POA: Diagnosis not present

## 2021-02-27 ENCOUNTER — Ambulatory Visit
Admission: RE | Admit: 2021-02-27 | Discharge: 2021-02-27 | Disposition: A | Discharge status: Home / Self Care | Payer: Medicare Other | Source: Ambulatory Visit | Attending: Radiation Oncology | Admitting: Radiation Oncology

## 2021-02-27 DIAGNOSIS — Z510 Encounter for antineoplastic radiation therapy: Secondary | ICD-10-CM | POA: Diagnosis not present

## 2021-02-28 ENCOUNTER — Ambulatory Visit
Admission: RE | Admit: 2021-02-28 | Discharge: 2021-02-28 | Disposition: A | Discharge status: Home / Self Care | Payer: Medicare Other | Source: Ambulatory Visit | Attending: Radiation Oncology | Admitting: Radiation Oncology

## 2021-02-28 DIAGNOSIS — Z510 Encounter for antineoplastic radiation therapy: Secondary | ICD-10-CM | POA: Diagnosis not present

## 2021-03-04 ENCOUNTER — Ambulatory Visit
Admission: RE | Admit: 2021-03-04 | Discharge: 2021-03-04 | Disposition: A | Discharge status: Home / Self Care | Payer: Medicare Other | Source: Ambulatory Visit | Attending: Radiation Oncology | Admitting: Radiation Oncology

## 2021-03-04 DIAGNOSIS — C61 Malignant neoplasm of prostate: Secondary | ICD-10-CM | POA: Diagnosis present

## 2021-03-04 DIAGNOSIS — Z510 Encounter for antineoplastic radiation therapy: Secondary | ICD-10-CM | POA: Diagnosis not present

## 2021-03-05 ENCOUNTER — Ambulatory Visit
Admission: RE | Admit: 2021-03-05 | Discharge: 2021-03-05 | Disposition: A | Discharge status: Home / Self Care | Payer: Medicare Other | Source: Ambulatory Visit | Attending: Radiation Oncology | Admitting: Radiation Oncology

## 2021-03-05 DIAGNOSIS — Z510 Encounter for antineoplastic radiation therapy: Secondary | ICD-10-CM | POA: Diagnosis not present

## 2021-03-06 ENCOUNTER — Ambulatory Visit
Admission: RE | Admit: 2021-03-06 | Discharge: 2021-03-06 | Disposition: A | Discharge status: Home / Self Care | Payer: Medicare Other | Source: Ambulatory Visit | Attending: Radiation Oncology | Admitting: Radiation Oncology

## 2021-03-06 DIAGNOSIS — Z510 Encounter for antineoplastic radiation therapy: Secondary | ICD-10-CM | POA: Diagnosis not present

## 2021-03-07 ENCOUNTER — Ambulatory Visit
Admission: RE | Admit: 2021-03-07 | Discharge: 2021-03-07 | Disposition: A | Discharge status: Home / Self Care | Payer: Medicare Other | Source: Ambulatory Visit | Attending: Radiation Oncology | Admitting: Radiation Oncology

## 2021-03-07 DIAGNOSIS — Z510 Encounter for antineoplastic radiation therapy: Secondary | ICD-10-CM | POA: Diagnosis not present

## 2021-03-10 ENCOUNTER — Ambulatory Visit
Admission: RE | Admit: 2021-03-10 | Discharge: 2021-03-10 | Disposition: A | Discharge status: Home / Self Care | Payer: Medicare Other | Source: Ambulatory Visit | Attending: Radiation Oncology | Admitting: Radiation Oncology

## 2021-03-10 DIAGNOSIS — Z510 Encounter for antineoplastic radiation therapy: Secondary | ICD-10-CM | POA: Diagnosis not present

## 2021-03-11 ENCOUNTER — Other Ambulatory Visit: Payer: Self-pay | Admitting: *Deleted

## 2021-03-11 ENCOUNTER — Ambulatory Visit
Admission: RE | Admit: 2021-03-11 | Discharge: 2021-03-11 | Disposition: A | Discharge status: Home / Self Care | Payer: Medicare Other | Source: Ambulatory Visit | Attending: Radiation Oncology | Admitting: Radiation Oncology

## 2021-03-11 DIAGNOSIS — Z510 Encounter for antineoplastic radiation therapy: Secondary | ICD-10-CM | POA: Diagnosis not present

## 2021-03-11 MED ORDER — TAMSULOSIN HCL 0.4 MG PO CAPS: 0.4000 mg | ORAL_CAPSULE | Freq: Every day | ORAL | 6 refills | Status: DC

## 2021-03-12 ENCOUNTER — Ambulatory Visit
Admission: RE | Admit: 2021-03-12 | Discharge: 2021-03-12 | Disposition: A | Discharge status: Home / Self Care | Payer: Medicare Other | Source: Ambulatory Visit | Attending: Radiation Oncology | Admitting: Radiation Oncology

## 2021-03-12 DIAGNOSIS — Z510 Encounter for antineoplastic radiation therapy: Secondary | ICD-10-CM | POA: Diagnosis not present

## 2021-03-13 ENCOUNTER — Ambulatory Visit
Admission: RE | Admit: 2021-03-13 | Discharge: 2021-03-13 | Disposition: A | Discharge status: Home / Self Care | Payer: Medicare Other | Source: Ambulatory Visit | Attending: Radiation Oncology | Admitting: Radiation Oncology

## 2021-03-13 ENCOUNTER — Inpatient Hospital Stay: Payer: Medicare Other | Attending: Radiation Oncology

## 2021-03-13 DIAGNOSIS — Z510 Encounter for antineoplastic radiation therapy: Secondary | ICD-10-CM | POA: Diagnosis not present

## 2021-03-14 ENCOUNTER — Ambulatory Visit
Admission: RE | Admit: 2021-03-14 | Discharge: 2021-03-14 | Disposition: A | Discharge status: Home / Self Care | Payer: Medicare Other | Source: Ambulatory Visit | Attending: Radiation Oncology | Admitting: Radiation Oncology

## 2021-03-14 DIAGNOSIS — Z510 Encounter for antineoplastic radiation therapy: Secondary | ICD-10-CM | POA: Diagnosis not present

## 2021-03-17 ENCOUNTER — Ambulatory Visit
Admission: RE | Admit: 2021-03-17 | Discharge: 2021-03-17 | Disposition: A | Discharge status: Home / Self Care | Payer: Medicare Other | Source: Ambulatory Visit | Attending: Radiation Oncology | Admitting: Radiation Oncology

## 2021-03-17 DIAGNOSIS — Z510 Encounter for antineoplastic radiation therapy: Secondary | ICD-10-CM | POA: Diagnosis not present

## 2021-03-18 ENCOUNTER — Ambulatory Visit
Admission: RE | Admit: 2021-03-18 | Discharge: 2021-03-18 | Disposition: A | Discharge status: Home / Self Care | Payer: Medicare Other | Source: Ambulatory Visit | Attending: Radiation Oncology | Admitting: Radiation Oncology

## 2021-03-18 DIAGNOSIS — Z510 Encounter for antineoplastic radiation therapy: Secondary | ICD-10-CM | POA: Diagnosis not present

## 2021-03-19 ENCOUNTER — Ambulatory Visit
Admission: RE | Admit: 2021-03-19 | Discharge: 2021-03-19 | Disposition: A | Discharge status: Home / Self Care | Payer: Medicare Other | Source: Ambulatory Visit | Attending: Radiation Oncology | Admitting: Radiation Oncology

## 2021-03-19 DIAGNOSIS — Z510 Encounter for antineoplastic radiation therapy: Secondary | ICD-10-CM | POA: Diagnosis not present

## 2021-03-20 ENCOUNTER — Ambulatory Visit
Admission: RE | Admit: 2021-03-20 | Discharge: 2021-03-20 | Disposition: A | Discharge status: Home / Self Care | Payer: Medicare Other | Source: Ambulatory Visit | Attending: Radiation Oncology | Admitting: Radiation Oncology

## 2021-03-20 DIAGNOSIS — Z510 Encounter for antineoplastic radiation therapy: Secondary | ICD-10-CM | POA: Diagnosis not present

## 2021-03-21 ENCOUNTER — Ambulatory Visit
Admission: RE | Admit: 2021-03-21 | Discharge: 2021-03-21 | Disposition: A | Discharge status: Home / Self Care | Payer: Medicare Other | Source: Ambulatory Visit | Attending: Radiation Oncology | Admitting: Radiation Oncology

## 2021-03-21 DIAGNOSIS — Z510 Encounter for antineoplastic radiation therapy: Secondary | ICD-10-CM | POA: Diagnosis not present

## 2021-03-24 ENCOUNTER — Ambulatory Visit
Admission: RE | Admit: 2021-03-24 | Discharge: 2021-03-24 | Disposition: A | Discharge status: Home / Self Care | Payer: Medicare Other | Source: Ambulatory Visit | Attending: Radiation Oncology | Admitting: Radiation Oncology

## 2021-03-24 DIAGNOSIS — Z510 Encounter for antineoplastic radiation therapy: Secondary | ICD-10-CM | POA: Diagnosis not present

## 2021-03-25 ENCOUNTER — Ambulatory Visit
Admission: RE | Admit: 2021-03-25 | Discharge: 2021-03-25 | Disposition: A | Discharge status: Home / Self Care | Payer: Medicare Other | Source: Ambulatory Visit | Attending: Radiation Oncology | Admitting: Radiation Oncology

## 2021-03-25 DIAGNOSIS — Z510 Encounter for antineoplastic radiation therapy: Secondary | ICD-10-CM | POA: Diagnosis not present

## 2021-03-26 ENCOUNTER — Ambulatory Visit
Admission: RE | Admit: 2021-03-26 | Discharge: 2021-03-26 | Disposition: A | Discharge status: Home / Self Care | Payer: Medicare Other | Source: Ambulatory Visit | Attending: Radiation Oncology | Admitting: Radiation Oncology

## 2021-03-26 DIAGNOSIS — Z510 Encounter for antineoplastic radiation therapy: Secondary | ICD-10-CM | POA: Diagnosis not present

## 2021-03-27 ENCOUNTER — Ambulatory Visit
Admission: RE | Admit: 2021-03-27 | Discharge: 2021-03-27 | Disposition: A | Discharge status: Home / Self Care | Payer: Medicare Other | Source: Ambulatory Visit | Attending: Radiation Oncology | Admitting: Radiation Oncology

## 2021-03-27 DIAGNOSIS — Z510 Encounter for antineoplastic radiation therapy: Secondary | ICD-10-CM | POA: Diagnosis not present

## 2021-03-28 ENCOUNTER — Ambulatory Visit
Admission: RE | Admit: 2021-03-28 | Discharge: 2021-03-28 | Disposition: A | Discharge status: Home / Self Care | Payer: Medicare Other | Source: Ambulatory Visit | Attending: Radiation Oncology | Admitting: Radiation Oncology

## 2021-03-28 DIAGNOSIS — Z510 Encounter for antineoplastic radiation therapy: Secondary | ICD-10-CM | POA: Diagnosis not present

## 2021-03-31 ENCOUNTER — Ambulatory Visit
Admission: RE | Admit: 2021-03-31 | Discharge: 2021-03-31 | Disposition: A | Discharge status: Home / Self Care | Payer: Medicare Other | Source: Ambulatory Visit | Attending: Radiation Oncology | Admitting: Radiation Oncology

## 2021-03-31 DIAGNOSIS — Z510 Encounter for antineoplastic radiation therapy: Secondary | ICD-10-CM | POA: Diagnosis not present

## 2021-04-01 ENCOUNTER — Ambulatory Visit
Admission: RE | Admit: 2021-04-01 | Discharge: 2021-04-01 | Disposition: A | Discharge status: Home / Self Care | Payer: Medicare Other | Source: Ambulatory Visit | Attending: Radiation Oncology | Admitting: Radiation Oncology

## 2021-04-01 DIAGNOSIS — Z510 Encounter for antineoplastic radiation therapy: Secondary | ICD-10-CM | POA: Diagnosis not present

## 2021-04-02 ENCOUNTER — Ambulatory Visit
Admission: RE | Admit: 2021-04-02 | Discharge: 2021-04-02 | Disposition: A | Discharge status: Home / Self Care | Payer: Medicare Other | Source: Ambulatory Visit | Attending: Radiation Oncology | Admitting: Radiation Oncology

## 2021-04-02 DIAGNOSIS — Z510 Encounter for antineoplastic radiation therapy: Secondary | ICD-10-CM | POA: Diagnosis not present

## 2021-04-02 DIAGNOSIS — C61 Malignant neoplasm of prostate: Secondary | ICD-10-CM | POA: Insufficient documentation

## 2021-04-03 ENCOUNTER — Ambulatory Visit
Admission: RE | Admit: 2021-04-03 | Discharge: 2021-04-03 | Disposition: A | Discharge status: Home / Self Care | Payer: Medicare Other | Source: Ambulatory Visit | Attending: Radiation Oncology | Admitting: Radiation Oncology

## 2021-04-03 DIAGNOSIS — Z510 Encounter for antineoplastic radiation therapy: Secondary | ICD-10-CM | POA: Diagnosis not present

## 2021-04-04 ENCOUNTER — Ambulatory Visit
Admission: RE | Admit: 2021-04-04 | Discharge: 2021-04-04 | Disposition: A | Discharge status: Home / Self Care | Payer: Medicare Other | Source: Ambulatory Visit | Attending: Radiation Oncology | Admitting: Radiation Oncology

## 2021-04-04 DIAGNOSIS — Z510 Encounter for antineoplastic radiation therapy: Secondary | ICD-10-CM | POA: Diagnosis not present

## 2021-04-07 ENCOUNTER — Ambulatory Visit
Admission: RE | Admit: 2021-04-07 | Discharge: 2021-04-07 | Disposition: A | Discharge status: Home / Self Care | Payer: Medicare Other | Source: Ambulatory Visit | Attending: Radiation Oncology | Admitting: Radiation Oncology

## 2021-04-07 DIAGNOSIS — Z510 Encounter for antineoplastic radiation therapy: Secondary | ICD-10-CM | POA: Diagnosis not present

## 2021-04-28 ENCOUNTER — Other Ambulatory Visit: Payer: Self-pay | Admitting: *Deleted

## 2021-04-28 MED ORDER — TAMSULOSIN HCL 0.4 MG PO CAPS: 0.4000 mg | ORAL_CAPSULE | Freq: Two times a day (BID) | ORAL | 6 refills | Status: DC

## 2021-05-09 ENCOUNTER — Other Ambulatory Visit: Payer: Self-pay

## 2021-05-09 ENCOUNTER — Encounter: Payer: Self-pay | Admitting: Radiation Oncology

## 2021-05-09 ENCOUNTER — Ambulatory Visit
Admission: RE | Admit: 2021-05-09 | Discharge: 2021-05-09 | Disposition: A | Discharge status: Home / Self Care | Payer: Medicare Other | Source: Ambulatory Visit | Attending: Radiation Oncology | Admitting: Radiation Oncology

## 2021-05-09 VITALS — BP 131/80 | HR 115 | Temp 97.8°F | Resp 17 | Ht 74.0 in | Wt 222.9 lb

## 2021-05-09 DIAGNOSIS — R351 Nocturia: Secondary | ICD-10-CM | POA: Insufficient documentation

## 2021-05-09 DIAGNOSIS — C61 Malignant neoplasm of prostate: Secondary | ICD-10-CM | POA: Insufficient documentation

## 2021-05-09 DIAGNOSIS — Z923 Personal history of irradiation: Secondary | ICD-10-CM | POA: Diagnosis not present

## 2021-05-09 NOTE — Progress Notes (Signed)
Radiation Oncology ?Follow up Note ? ?Name: Randy Mcmillan.   ?Date:   05/09/2021 ?MRN:  [id number] ?DOB: 02-Nov-1940  ? ? ?This 81 y.o. male presents to the clinic today for 1 month follow-up status post image guided IMRT radiation therapy for Gleason 7 (4+3) presenting with a PSA of 5.5. ? ?REFERRING PROVIDER: Kirk Ruths, MD ? ?HPI: Patient is a 81 year old male.  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  He is seen today in routine follow-up and is doing well.  He is on Flomax twice a day am cutting that back to once a day since he is having some issues with feeling fatigued during the day.  He has nocturia x3-4 which has been normal for him no bowel problems. ? ?COMPLICATIONS OF TREATMENT: none ? ?FOLLOW UP COMPLIANCE: keeps appointments  ? ?PHYSICAL EXAM:  ?BP 131/80   Pulse (!) 115   Temp 97.8 ?F (36.6 ?C)   Resp 17   Ht '6\' 2"'$  (1.88 m)   Wt 222 lb 14.4 oz (101.1 kg)   BMI 28.62 kg/m?  ?Well-developed well-nourished patient in NAD. HEENT reveals PERLA, EOMI, discs not visualized.  Oral cavity is clear. No oral mucosal lesions are identified. Neck is clear without evidence of cervical or supraclavicular adenopathy. Lungs are clear to A&P. Cardiac examination is essentially unremarkable with regular rate and rhythm without murmur rub or thrill. Abdomen is benign with no organomegaly or masses noted. Motor sensory and DTR levels are equal and symmetric in the upper and lower extremities. Cranial nerves II through XII are grossly intact. Proprioception is intact. No peripheral adenopathy or edema is identified. No motor or sensory levels are noted. Crude visual fields are within normal range. ? ?RADIOLOGY RESULTS: No current films for review ? ?PLAN: Present time patient is doing well very low side effect profile.  He will be seeing Dr. Erlene Quan in June with a PSA check.  I will see him shortly thereafter for follow-up.  Otherwise  pleased with his overall progress.  Patient is to call with any concerns. ? ?I would like to take this opportunity to thank you for allowing me to participate in the care of your patient.. ?  ? Noreene Filbert, MD ? ?

## 2021-06-14 ENCOUNTER — Other Ambulatory Visit: Payer: Self-pay | Admitting: Urology

## 2021-08-06 ENCOUNTER — Other Ambulatory Visit: Payer: Self-pay | Admitting: *Deleted

## 2021-08-06 DIAGNOSIS — C61 Malignant neoplasm of prostate: Secondary | ICD-10-CM

## 2021-08-07 ENCOUNTER — Other Ambulatory Visit: Payer: Self-pay

## 2021-08-07 ENCOUNTER — Other Ambulatory Visit: Payer: Medicare Other

## 2021-08-07 DIAGNOSIS — C61 Malignant neoplasm of prostate: Secondary | ICD-10-CM

## 2021-08-08 LAB — PSA: Prostate Specific Ag, Serum: <0.1 ng/mL (ref 0.0–4.0)

## 2021-08-12 ENCOUNTER — Telehealth: Payer: Self-pay | Admitting: *Deleted

## 2021-08-12 ENCOUNTER — Ambulatory Visit (INDEPENDENT_AMBULATORY_CARE_PROVIDER_SITE_OTHER): Payer: Medicare Other | Admitting: Urology

## 2021-08-12 ENCOUNTER — Encounter: Payer: Self-pay | Admitting: Urology

## 2021-08-12 VITALS — BP 109/68 | HR 105 | Ht 74.0 in | Wt 220.0 lb

## 2021-08-12 DIAGNOSIS — Z8546 Personal history of malignant neoplasm of prostate: Secondary | ICD-10-CM | POA: Diagnosis not present

## 2021-08-12 DIAGNOSIS — C61 Malignant neoplasm of prostate: Secondary | ICD-10-CM

## 2021-08-12 MED ORDER — LEUPROLIDE ACETATE (6 MONTH) 45 MG ~~LOC~~ KIT
45.0000 mg | PACK | Freq: Once | SUBCUTANEOUS | Status: AC
  Administered 2021-08-12: 45 mg via SUBCUTANEOUS

## 2021-08-12 NOTE — Patient Instructions (Signed)
continue on Vitamin D 800-1000iu and Calcium 1000-1200mg daily while on Androgen Deprivation Therapy.  

## 2021-08-12 NOTE — Progress Notes (Signed)
08/12/21 12:15 PM   [name] 1940/03/26 [id number]  Referring provider:  Kirk Ruths, MD [place] [facility] [place],  [place] [address] Chief Complaint  Patient presents with   Prostate Cancer     HPI: Randy Mcmillan. is a 81 y.o.male with a personal history of prostate cancer and BPH who presents today for a 6 month follow-up with Eligard injection and  PSA prior   He was started on finasteride on 04/14/2018 and his PSA trended down. Shortly after his PSA had risen. He underwent repeat prostate MR that revealed a reduction in his prostate volume to 37 cc. He elected to pursue MRI as a first step for further diagnostic evaluation given his age and comorbidities over a phone call on 03/14/2018. On his MRI he had a PI-RADS 4 lesion in which he elected to have a prostate biopsy (standard, non fusion) on 03/30/2018. Prostate bx results showed no evidence of cancer. His TRUS volume is 53 grams.     01/20/2021 repeat fusion biopsy revealed Gleason 3+3=6, Gleason 3+4=7, Gleason 4+3=7, Gleason 4+4=8.    He has elected he EBRT plus ADT x2 to 3 years  CT abdomen and pelvis on 02/05/2021 showed no evidence of mass, lymphadenopathy, or metastatic disease in the abdomen or pelvis.  His most recent PSA was undetectable on 08/07/2021.   He is s/p prostate gold see marker placement on 02/06/2021. He has not been taking supplements for bone health.   He is now status post IORT and did well with this.  He is doing well today and he denies any new urinary symptoms.  His occasional hot flashes but they are not bothersome.  He has not been taking his calcium.  He mentions today feeling somewhat weak and lightheaded at times, wonders if he needs a cardiac work-up.  PMH: Past Medical History:  Diagnosis Date   Anxiety 09/02/2013   On rare 1/2 mg xanax   Last Assessment & Plan:  Doing well on rare xanax   Chronic kidney disease, stage III (moderate) ([place])  09/12/2013   Essential (primary) hypertension 09/02/2013   Last Assessment & Plan:  Taking medications without noted side effects or dizziness.   Hyperlipidemia associated with type 2 diabetes mellitus ([place]) 09/02/2013   Last Assessment & Plan:  Diet for healthy cholesterol is being attempted and no clear myalgia's or other side effects are noted.   Tremor 09/02/2013   Last Assessment & Plan:  Tremor is essentially unchanged.   Type 2 diabetes mellitus ([place]) 09/02/2013   Last Assessment & Plan:  Has been working on a low fat diet and no myalgia's are present.    Surgical History: History reviewed. No pertinent surgical history.  Home Medications:  Allergies as of 08/12/2021   No Known Allergies      Medication List        Accurate as of August 12, 2021 12:15 PM. If you have any questions, ask your nurse or doctor.          STOP taking these medications    finasteride 5 MG tablet Commonly known as: PROSCAR Stopped by: Hollice Espy, MD       TAKE these medications    aspirin 81 MG chewable tablet Chew by mouth.   atorvastatin 40 MG tablet Commonly known as: LIPITOR Take by mouth.   glipiZIDE 5 MG tablet Commonly known as: GLUCOTROL Take by mouth.   Januvia 100 MG tablet Generic  drug: sitaGLIPtin   lisinopril 10 MG tablet Commonly known as: ZESTRIL Take by mouth.   metFORMIN 1000 MG tablet Commonly known as: GLUCOPHAGE Take by mouth.   pioglitazone 30 MG tablet Commonly known as: ACTOS Take 30 mg by mouth daily.   tamsulosin 0.4 MG Caps capsule Commonly known as: FLOMAX Take 1 capsule (0.4 mg total) by mouth in the morning and at bedtime.        Allergies:  No Known Allergies  Family History: History reviewed. No pertinent family history.  Social History:  reports that he has never smoked. He has never used smokeless tobacco. He reports current alcohol use. He reports that he does not use drugs.   Physical Exam: BP 109/68   Pulse (!) 105   Ht  '6\' 2"'$  (1.88 m)   Wt 220 lb (99.8 kg)   BMI 28.25 kg/m   Constitutional:  Alert and oriented, No acute distress. HEENT: [place] AT, moist mucus membranes.  Trachea midline, no masses. Cardiovascular: No clubbing, cyanosis, or edema. Respiratory: Normal respiratory effort, no increased work of breathing. Skin: No rashes, bruises or suspicious lesions. Neurologic: Grossly intact, no focal deficits, moving all 4 extremities. Psychiatric: Normal mood and affect.  Laboratory Data:  Lab Results  Component Value Date   CREATININE 1.30 (H) 02/05/2021    Assessment & Plan:   Prostate cancer - High risk - Managed with radiation and ADT, psa responding well - Eligard injection given today  - Reviewed importance of bone health on ADT, recommend 1000-1200 mg daily calcium suppliment and [phone] IU vit D daily.  Also encouraged weight being exercises and cardiovascular health. - Follow-up in 6 months   2.  Lightheadedness/fatigue -We discussed that fatigue may be related to hormonal treatment however lightheadedness is not a side effect and if he feels that there could be an underlying cardiac issue, he needs to follow-up with his PCP to discuss this further and urged him to do so -Lightheadedness may also be side effect of Flomax, advised to hold Flomax to see if it improves especially in light of minimal urinary symptoms  F/u 6 mo ADT/ PSA  Conley Rolls as a scribe for Hollice Espy, MD.,have documented all relevant documentation on the behalf of Hollice Espy, MD,as directed by  Hollice Espy, MD while in the presence of Hollice Espy, MD.  I have reviewed the above documentation for accuracy and completeness, and I agree with the above.   Hollice Espy, MD    [facility] [address] [place], [place] [address] [phone]

## 2021-08-12 NOTE — Progress Notes (Signed)
Eligard SubQ Injection   Due to Prostate Cancer patient is present today for a Eligard Injection.  Medication: Eligard 6 month Dose: 45 mg  Location: right  Lot: [id number] Exp: 01/2023  Patient tolerated well, no complications were noted  Performed by: Elberta Leatherwood, [place]  Per Dr. Erlene Quan patient is to continue therapy for 2 years . Patient's next follow up was scheduled for December . This appointment was scheduled using wheel and given to patient today along with reminder continue on Vitamin D 800-1000iu and Calcium 1000-'1200mg'$  daily while on Androgen Deprivation Therapy.  PA approval dates: NO PA needed

## 2021-08-12 NOTE — Telephone Encounter (Addendum)
Patient informed. He will stop taking Flomax and let us know how his symptoms have improved.     ----- Message from Hollice Espy, MD sent at 08/12/2021 12:18 PM EDT ----- I saw this gentleman earlier today and he mentioned that he is occasionally lightheaded.  I did not realize that he still on Flomax.  We do let him know that that might be a side effect from this medication and have him consider holding and/or stopping this for a period of time to see if it improves?  Hollice Espy, MD

## 2021-08-22 ENCOUNTER — Ambulatory Visit: Payer: Medicare Other | Admitting: Radiation Oncology

## 2021-09-05 ENCOUNTER — Ambulatory Visit
Admission: RE | Admit: 2021-09-05 | Discharge: 2021-09-05 | Disposition: A | Discharge status: Home / Self Care | Payer: Medicare Other | Source: Ambulatory Visit | Attending: Radiation Oncology | Admitting: Radiation Oncology

## 2021-09-05 ENCOUNTER — Encounter: Payer: Self-pay | Admitting: Radiation Oncology

## 2021-09-05 VITALS — BP 124/76 | HR 89 | Temp 98.0°F | Resp 16 | Ht 74.0 in | Wt 222.6 lb

## 2021-09-05 DIAGNOSIS — C61 Malignant neoplasm of prostate: Secondary | ICD-10-CM | POA: Insufficient documentation

## 2021-09-05 DIAGNOSIS — Z923 Personal history of irradiation: Secondary | ICD-10-CM | POA: Insufficient documentation

## 2021-09-05 NOTE — Progress Notes (Signed)
Radiation Oncology Follow up Note  Name: Randy Mcmillan.   Date:   09/05/2021 MRN:  [id number] DOB: 01-Jan-1941    This 81 y.o. male presents to the clinic today for 56-monthfollow-up status post image guided IMRT radiation therapy for Gleason 7 (4+3) adenocarcinoma the prostate presenting with a PSA of 5.5.  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  Seen today in routine follow-up he is doing well.  He is currently on Flomax had some problems with some lightheadedness although that is cleared he really believes the Flomax is helping him.  He is having no bowel problems.  His most recent PSA is less than 0.1.  COMPLICATIONS OF TREATMENT: none  FOLLOW UP COMPLIANCE: keeps appointments   PHYSICAL EXAM:  BP 124/76 (BP Location: Right Arm, Patient Position: Sitting, Cuff Size: Normal)   Pulse 89   Temp 98 F (36.7 C) (Tympanic)   Resp 16   Ht '6\' 2"'$  (1.88 m)   Wt 222 lb 9.6 oz (101 kg)   BMI 28.58 kg/m  Well-developed well-nourished patient in NAD. HEENT reveals PERLA, EOMI, discs not visualized.  Oral cavity is clear. No oral mucosal lesions are identified. Neck is clear without evidence of cervical or supraclavicular adenopathy. Lungs are clear to A&P. Cardiac examination is essentially unremarkable with regular rate and rhythm without murmur rub or thrill. Abdomen is benign with no organomegaly or masses noted. Motor sensory and DTR levels are equal and symmetric in the upper and lower extremities. Cranial nerves II through XII are grossly intact. Proprioception is intact. No peripheral adenopathy or edema is identified. No motor or sensory levels are noted. Crude visual fields are within normal range.  RADIOLOGY RESULTS: No current films for review  PLAN: Present time patient is under excellent biochemical control of his prostate  cancer.  And pleased with his overall progress.  I have asked to see him back in 6 months and will probably have a PSA in Dr. BCherrie Gauzeoffice prior to that next visit.  Patient knows to call with any concerns.  I would like to take this opportunity to thank you for allowing me to participate in the care of your patient..Noreene Filbert MD

## 2022-02-12 ENCOUNTER — Other Ambulatory Visit: Payer: Medicare Other

## 2022-02-12 ENCOUNTER — Other Ambulatory Visit: Payer: Self-pay | Admitting: Radiation Oncology

## 2022-02-12 DIAGNOSIS — C61 Malignant neoplasm of prostate: Secondary | ICD-10-CM

## 2022-02-13 ENCOUNTER — Other Ambulatory Visit: Payer: Medicare Other

## 2022-02-13 LAB — PSA: Prostate Specific Ag, Serum: <0.1 ng/mL (ref 0.0–4.0)

## 2022-02-16 ENCOUNTER — Ambulatory Visit: Payer: Medicare Other | Admitting: Radiation Oncology

## 2022-02-17 ENCOUNTER — Other Ambulatory Visit: Payer: Self-pay | Admitting: *Deleted

## 2022-02-17 ENCOUNTER — Ambulatory Visit (INDEPENDENT_AMBULATORY_CARE_PROVIDER_SITE_OTHER): Payer: Medicare Other | Admitting: Urology

## 2022-02-17 VITALS — BP 105/69 | HR 106 | Ht 74.0 in | Wt 220.2 lb

## 2022-02-17 DIAGNOSIS — C61 Malignant neoplasm of prostate: Secondary | ICD-10-CM | POA: Diagnosis not present

## 2022-02-17 MED ORDER — TAMSULOSIN HCL 0.4 MG PO CAPS: 0.8000 mg | ORAL_CAPSULE | Freq: Every day | ORAL | 2 refills | Status: DC

## 2022-02-17 MED ORDER — LEUPROLIDE ACETATE (6 MONTH) 45 MG ~~LOC~~ KIT
45.0000 mg | PACK | Freq: Once | SUBCUTANEOUS | Status: AC
  Administered 2022-02-17: 45 mg via SUBCUTANEOUS

## 2022-02-17 NOTE — Progress Notes (Signed)
Eligard SubQ Injection   Due to Prostate Cancer patient is present today for a Eligard Injection.  Medication: Eligard 6 month Dose: 45 mg  Location: left  Lot: [id number] Exp: 03/2023  Patient tolerated well, no complications were noted  Performed by: S.Tenille Morrill, CMA  Per Dr. Erlene Quan patient is to continue therapy for 6 months . Patient's next follow up was scheduled for 08/18/2022. This appointment was scheduled using wheel and given to patient today along with reminder continue on Vitamin D 800-1000iu and Calcium 1000-'1200mg'$  daily while on Androgen Deprivation Therapy.  PA approval dates:

## 2022-02-17 NOTE — Progress Notes (Signed)
02/17/2022 1:14 PM   Randy Mcmillan Jan 14, 1941 [id number]  Referring provider: Kirk Ruths, MD [place] [facility] [place],  [place] [address]  Chief Complaint  Patient presents with   Prostate Cancer    HPI: 81 year old male with personal history of high risk prostate cancer status post EBRT currently on ADT who returns today for follow-up.  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  He completed IMRT in 04/2021.  He is currently on ADT.  PSA from 12/14 2023 is undetectable  He has been tolerating the injections well.  He occasionally has hot flash.  He is taking both calcium and he believes vitamin D.  He denies any urinary symptoms today.   PMH: Past Medical History:  Diagnosis Date   Anxiety 09/02/2013   On rare 1/2 mg xanax   Last Assessment & Plan:  Doing well on rare xanax   Chronic kidney disease, stage III (moderate) ([place]) 09/12/2013   Essential (primary) hypertension 09/02/2013   Last Assessment & Plan:  Taking medications without noted side effects or dizziness.   Hyperlipidemia associated with type 2 diabetes mellitus ([place]) 09/02/2013   Last Assessment & Plan:  Diet for healthy cholesterol is being attempted and no clear myalgia's or other side effects are noted.   Tremor 09/02/2013   Last Assessment & Plan:  Tremor is essentially unchanged.   Type 2 diabetes mellitus ([place]) 09/02/2013   Last Assessment & Plan:  Has been working on a low fat diet and no myalgia's are present.    Surgical History: No past surgical history on file.  Home Medications:  Allergies as of 02/17/2022   No Known Allergies      Medication List        Accurate as of February 17, 2022  1:14 PM. If you have any questions, ask your nurse or doctor.          STOP taking these medications    Januvia 100 MG tablet Generic drug: sitaGLIPtin Stopped by: Hollice Espy, MD       TAKE these medications    aspirin 81 MG chewable tablet Chew by mouth.   atorvastatin 40 MG tablet Commonly known as: LIPITOR Take by mouth.   glipiZIDE 5 MG tablet Commonly known as: GLUCOTROL Take by mouth.   Jardiance 10 MG Tabs tablet Generic drug: empagliflozin Take 10 mg by mouth daily.   lisinopril 10 MG tablet Commonly known as: ZESTRIL Take by mouth.   metFORMIN 1000 MG tablet Commonly known as: GLUCOPHAGE Take by mouth.   OVER THE COUNTER MEDICATION CALCIUM 500 MG 1 TABLET DAILY   pioglitazone 30 MG tablet Commonly known as: ACTOS Take 30 mg by mouth daily.   tamsulosin 0.4 MG Caps capsule Commonly known as: FLOMAX Take 1 capsule (0.4 mg total) by mouth in the morning and at bedtime. What changed: Another medication with the same name was added. Make sure you understand how and when to take each. Changed by: Manus Rudd, RN   tamsulosin 0.4 MG Caps capsule Commonly known as: FLOMAX Take 2 capsules (0.8 mg total) by mouth daily after supper. What changed: You were already taking a medication with the same name, and this prescription was added. Make sure you understand how and when to take each. Changed by: Manus Rudd, RN        Allergies: No Known  Allergies  Family History: No family history on file.  Social History:  reports that he has never smoked. He has never used smokeless tobacco. He reports current alcohol use. He reports that he does not use drugs.   Physical Exam: BP 105/69   Pulse (!) 106   Ht '6\' 2"'$  (1.88 m)   Wt 220 lb 4 oz (99.9 kg)   BMI 28.28 kg/m   Constitutional:  Alert and oriented, No acute distress. HEENT: [place] AT, moist mucus membranes.  Trachea midline, no masses. Cardiovascular: No clubbing, cyanosis, or edema. Neurologic: Grossly intact, no focal deficits, moving all 4 extremities. Psychiatric: Normal mood and affect.   Assessment & Plan:    1. Prostate cancer ([place]) Post IMRT  currently undergoing 2 to 3 years of ADT therapy  Tolerating medication well with minimal side effects  PSA remains undetectable  60-monthDepo given today, which should recheck PSA in 6 months and continue therapy at least through the end of 2024 and then reassess - leuprolide (6 Month) (ELIGARD) injection 45 mg - PSA; Future  F/u 6 mo PSA/ Eligard  AHollice Espy MD  [address] [place] [phone]

## 2022-02-25 ENCOUNTER — Encounter: Payer: Self-pay | Admitting: Radiation Oncology

## 2022-02-25 ENCOUNTER — Ambulatory Visit
Admission: RE | Admit: 2022-02-25 | Discharge: 2022-02-25 | Disposition: A | Discharge status: Home / Self Care | Payer: Medicare Other | Source: Ambulatory Visit | Attending: Radiation Oncology | Admitting: Radiation Oncology

## 2022-02-25 VITALS — BP 129/79 | HR 115 | Temp 96.1°F | Resp 14 | Ht 74.0 in | Wt 215.2 lb

## 2022-02-25 DIAGNOSIS — Z923 Personal history of irradiation: Secondary | ICD-10-CM | POA: Insufficient documentation

## 2022-02-25 DIAGNOSIS — C61 Malignant neoplasm of prostate: Secondary | ICD-10-CM | POA: Diagnosis not present

## 2022-02-25 NOTE — Progress Notes (Signed)
Radiation Oncology Follow up Note  Name: Randy Mcmillan.   Date:   02/25/2022 MRN:  [id number] DOB: [birth year]    This 81 y.o. male presents to the clinic today for 35-monthfollow-up status post image guided IMRT radiation therapy for Gleason 7 (4+3) adenocarcinoma the prostate presenting with a PSA of 5.5.  REFERRING PROVIDER: AKirk Ruths MD  HPI: Patient is a 81year old male now out 10 months having completed IMRT radiation therapy to his prostate for Gleason 7 adenocarcinoma.  Seen today in routine follow-up he is doing well.  He specifically denies any increased lower urinary tract symptoms diarrhea or fatigue.  He states his lower urinary tract symptoms have markedly improved over the last 6 months.  His most recent PSA is less than 0.1..Marland Kitchen COMPLICATIONS OF TREATMENT: none  FOLLOW UP COMPLIANCE: keeps appointments   PHYSICAL EXAM:  BP 129/79   Pulse (!) 115   Temp (!) 96.1 F (35.6 C)   Resp 14   Ht '6\' 2"'$  (1.88 m)   Wt 215 lb 3.2 oz (97.6 kg)   BMI 27.63 kg/m  Well-developed well-nourished patient in NAD. HEENT reveals PERLA, EOMI, discs not visualized.  Oral cavity is clear. No oral mucosal lesions are identified. Neck is clear without evidence of cervical or supraclavicular adenopathy. Lungs are clear to A&P. Cardiac examination is essentially unremarkable with regular rate and rhythm without murmur rub or thrill. Abdomen is benign with no organomegaly or masses noted. Motor sensory and DTR levels are equal and symmetric in the upper and lower extremities. Cranial nerves II through XII are grossly intact. Proprioception is intact. No peripheral adenopathy or edema is identified. No motor or sensory levels are noted. Crude visual fields are within normal range.  RADIOLOGY RESULTS: No current films for review  PLAN: The present time patient is under excellent biochemical control of his prostate cancer.  And plan pleased with his overall progress.  Of asked to see  him back in 6 months for follow-up and then will start once year follow-up visits.  I would like to take this opportunity to thank you for allowing me to participate in the care of your patient..Noreene Filbert MD

## 2022-08-13 ENCOUNTER — Other Ambulatory Visit: Payer: Medicare Other

## 2022-08-13 DIAGNOSIS — C61 Malignant neoplasm of prostate: Secondary | ICD-10-CM

## 2022-08-14 LAB — PSA: Prostate Specific Ag, Serum: <0.1 ng/mL (ref 0.0–4.0)

## 2022-08-18 ENCOUNTER — Ambulatory Visit (INDEPENDENT_AMBULATORY_CARE_PROVIDER_SITE_OTHER): Payer: Medicare Other | Admitting: Urology

## 2022-08-18 ENCOUNTER — Ambulatory Visit: Payer: Medicare Other | Attending: Radiation Oncology | Admitting: Radiation Oncology

## 2022-08-18 VITALS — BP 99/63 | HR 102 | Ht 74.0 in | Wt 206.5 lb

## 2022-08-18 DIAGNOSIS — C61 Malignant neoplasm of prostate: Secondary | ICD-10-CM

## 2022-08-18 DIAGNOSIS — R3912 Poor urinary stream: Secondary | ICD-10-CM | POA: Diagnosis not present

## 2022-08-18 DIAGNOSIS — R351 Nocturia: Secondary | ICD-10-CM | POA: Diagnosis not present

## 2022-08-18 MED ORDER — SILODOSIN 8 MG PO CAPS: 8.0000 mg | ORAL_CAPSULE | Freq: Every day | ORAL | 11 refills | Status: DC

## 2022-08-18 MED ORDER — SILODOSIN 8 MG PO CAPS
8.0000 mg | ORAL_CAPSULE | Freq: Every day | ORAL | 0 refills | Status: DC
Start: 2022-08-18 — End: 2023-08-10

## 2022-08-18 MED ORDER — LEUPROLIDE ACETATE (6 MONTH) 45 MG ~~LOC~~ KIT
45.0000 mg | PACK | Freq: Once | SUBCUTANEOUS | Status: AC
Start: 2022-08-18 — End: 2022-08-18
  Administered 2022-08-18: 45 mg via SUBCUTANEOUS

## 2022-08-18 NOTE — Progress Notes (Signed)
Eligard SubQ Injection   Due to Prostate Cancer patient is present today for a Eligard Injection.  Medication: Eligard every 6 month Dose: 45 mg  Location: right abdomen Lot: [id number] Exp: 11/2023  Patient tolerated well, no complications were noted  Performed by: Jearld Pies RMA  Per Dr. Dr Apolinar Junes patient is to continue therapy for at least until end of 2024 . Patient's next follow up was scheduled for 02/17/23. This appointment was scheduled using wheel and given to patient today along with reminder continue on Vitamin D 800-1000iu and Calcium 1000-1200mg  daily while on Androgen Deprivation Therapy.  PA approval dates:  NO PA needed

## 2022-08-18 NOTE — Progress Notes (Signed)
I,Amy L Pierron,acting as a scribe for Vanna Scotland, MD.,have documented all relevant documentation on the behalf of Vanna Scotland, MD,as directed by  Vanna Scotland, MD while in the presence of Vanna Scotland, MD.  08/18/2022 10:43 AM   Earnstine Regal 1940-12-08 [id number]  Referring provider: Lauro Regulus, MD [address] [facility] [place] [facility] [place],  [place] [address]  Chief Complaint  Patient presents with   Prostate Cancer    HPI: 82 year-old male with a personal history of prostate cancer returns today for a follow-up.  He is s/p EBRT, currently on ADT, and was last seen in December 2023.   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   He completed IMRT in 04/2021. He is currently on ADT.   PSA from 08/13/2022 is undetectable.  He reports a weak urinary stream. Dr. Rushie Chestnut has started him on Flomax. He does has some dizziness and his blood pressure runs low, mostly during the day. He has nocturia 3-4 times. He experiences some fatigue.    PMH: Past Medical History:  Diagnosis Date   Anxiety 09/02/2013   On rare 1/2 mg xanax   Last Assessment & Plan:  Doing well on rare xanax   Chronic kidney disease, stage III (moderate) (HCC) 09/12/2013   Essential (primary) hypertension 09/02/2013   Last Assessment & Plan:  Taking medications without noted side effects or dizziness.   Hyperlipidemia associated with type 2 diabetes mellitus (HCC) 09/02/2013   Last Assessment & Plan:  Diet for healthy cholesterol is being attempted and no clear myalgia's or other side effects are noted.   Tremor 09/02/2013   Last Assessment & Plan:  Tremor is essentially unchanged.   Type 2 diabetes mellitus (HCC) 09/02/2013   Last Assessment & Plan:  Has been working on a low fat diet and no myalgia's are present.    Home Medications:  Allergies as of 08/18/2022   No Known Allergies       Medication List        Accurate as of August 18, 2022 10:43 AM. If you have any questions, ask your nurse or doctor.          STOP taking these medications    lisinopril 10 MG tablet Commonly known as: ZESTRIL Stopped by: Vanna Scotland, MD   tamsulosin 0.4 MG Caps capsule Commonly known as: FLOMAX Stopped by: Vanna Scotland, MD       TAKE these medications    aspirin 81 MG chewable tablet Chew by mouth.   atorvastatin 40 MG tablet Commonly known as: LIPITOR Take by mouth.   glipiZIDE 5 MG tablet Commonly known as: GLUCOTROL Take by mouth.   Jardiance 25 MG Tabs tablet Generic drug: empagliflozin Take 25 mg by mouth daily. What changed: Another medication with the same name was removed. Continue taking this medication, and follow the directions you see here. Changed by: Vanna Scotland, MD   metFORMIN 1000 MG tablet Commonly known as: GLUCOPHAGE Take by mouth.   OVER THE COUNTER MEDICATION CALCIUM 500 MG 1 TABLET DAILY   pioglitazone 15 MG tablet Commonly known as: ACTOS Take 15 mg by mouth daily. What changed: Another medication with the same name was removed. Continue taking this medication, and follow the directions you see here. Changed by: Vanna Scotland, MD   silodosin 8 MG Caps capsule Commonly known as: RAPAFLO Take 1 capsule (8 mg total)  by mouth daily with breakfast. Started by: Vanna Scotland, MD        Social History:  reports that he has never smoked. He has never used smokeless tobacco. He reports current alcohol use. He reports that he does not use drugs.   Physical Exam: BP 99/63   Pulse (!) 102   Ht 6\' 2"  (1.88 m)   Wt 206 lb 8 oz (93.7 kg)   BMI 26.51 kg/m   Constitutional:  Alert and oriented, No acute distress. HEENT: [place] AT, moist mucus membranes.  Trachea midline, no masses. Neurologic: Grossly intact, no focal deficits, moving all 4 extremities. Psychiatric: Normal mood and affect.   Assessment & Plan:    1.  Prostate cancer (HCC)  - Status post IMRT with 2 to 3 years of ADT therapy.  - Tolerating medication well. Experiencing some fatigue.  - PSA remains undetectable  - 16-month Depo given today, leuprolide (6 Month) (ELIGARD) injection 45 mg  -  Recheck PSA in 6 months and continue therapy at least through the end of 2024 and then reassess.  2. Weak stream and nocturia  - We discussed increasing the dose of Flomax versus changing the medication to a different alpha blocker. Has been experiencing dizziness. Not 100% sure if related to Flomax but decided to try Rapaflo and see if any improvement.  - Rapaflo prescription sent to pharmacy. Stop taking Flomax.  Return in about 6 months (around 02/17/2023) for PSA and Eligard.  I have reviewed the above documentation for accuracy and completeness, and I agree with the above.   Vanna Scotland, MD    [facility] [address], [address] [place], [place] [address] [phone]

## 2023-02-12 ENCOUNTER — Other Ambulatory Visit: Payer: Self-pay

## 2023-02-12 DIAGNOSIS — C61 Malignant neoplasm of prostate: Secondary | ICD-10-CM

## 2023-02-15 ENCOUNTER — Other Ambulatory Visit: Payer: Medicare Other

## 2023-02-15 DIAGNOSIS — C61 Malignant neoplasm of prostate: Secondary | ICD-10-CM

## 2023-02-16 LAB — PSA: Prostate Specific Ag, Serum: <0.1 ng/mL (ref 0.0–4.0)

## 2023-02-17 ENCOUNTER — Ambulatory Visit (INDEPENDENT_AMBULATORY_CARE_PROVIDER_SITE_OTHER): Payer: Medicare Other | Admitting: Urology

## 2023-02-17 VITALS — BP 148/79 | HR 86 | Ht 74.0 in | Wt 212.2 lb

## 2023-02-17 DIAGNOSIS — C61 Malignant neoplasm of prostate: Secondary | ICD-10-CM

## 2023-02-17 DIAGNOSIS — R351 Nocturia: Secondary | ICD-10-CM | POA: Diagnosis not present

## 2023-02-17 DIAGNOSIS — R3912 Poor urinary stream: Secondary | ICD-10-CM

## 2023-02-17 MED ORDER — LEUPROLIDE ACETATE (6 MONTH) 45 MG ~~LOC~~ KIT
45.0000 mg | PACK | Freq: Once | SUBCUTANEOUS | Status: AC
  Administered 2023-02-17: 45 mg via SUBCUTANEOUS

## 2023-02-17 NOTE — Progress Notes (Unsigned)
Eligard SubQ Injection   Due to Prostate Cancer patient is present today for a Eligard Injection.  Medication: Eligard 6 month Dose: 45 mg  Location: left upper abdomen  Lot: [id number] Exp: 04/2024  Patient tolerated well, no complications were noted  Performed by: Sanyiah Kanzler  Per Dr. Royston Bake patient is to continue therapy for 6 more months at least. Patient's next follow up was scheduled for 08/11/2023. This appointment was scheduled using wheel and given to patient today along with reminder continue on Vitamin D 800-1000iu and Calcium 1000-1200mg  daily while on Androgen Deprivation Therapy.  PA approval dates: No PA required.

## 2023-02-17 NOTE — Progress Notes (Signed)
Marcelle Overlie Plume,acting as a scribe for Randy Scotland, MD.,have documented all relevant documentation on the behalf of Randy Scotland, MD,as directed by  Randy Scotland, MD while in the presence of Randy Scotland, MD.  02/17/23 11:17 AM   Earnstine Regal [birth year] [id number]  Referring provider: Lauro Regulus, MD [address] [facility] [place] [facility] [place],  [place] [address]  Chief Complaint  Patient presents with   Prostate Cancer    HPI: 82 year-old male with a personal history of prostate cancer returns today for a follow-up.   He is s/p EBRT, currently on ADT, and was last seen in December 2023.    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   He completed IMRT in 04/2021. He is currently on ADT.   PSA from 02/15/2023 is undetectable.  He was on Flomax for his urinary symptoms, which was changed to Rapaflo secondary to dizziness. He reports improvement in urinary symptoms with Rapaflo and no dizziness, indicating good tolerance and effectiveness of the medication. He has not been taking vitamin D supplements regularly as previously suggested while undergoing ADT.     PMH: Past Medical History:  Diagnosis Date   Anxiety 09/02/2013   On rare 1/2 mg xanax   Last Assessment & Plan:  Doing well on rare xanax   Chronic kidney disease, stage III (moderate) (HCC) 09/12/2013   Essential (primary) hypertension 09/02/2013   Last Assessment & Plan:  Taking medications without noted side effects or dizziness.   Hyperlipidemia associated with type 2 diabetes mellitus (HCC) 09/02/2013   Last Assessment & Plan:  Diet for healthy cholesterol is being attempted and no clear myalgia's or other side effects are noted.   Tremor 09/02/2013   Last Assessment & Plan:  Tremor is essentially unchanged.   Type 2 diabetes mellitus (HCC) 09/02/2013   Last Assessment & Plan:  Has been working on a low fat  diet and no myalgia's are present.    Surgical History: No past surgical history on file.  Home Medications:  Allergies as of 02/17/2023   No Known Allergies      Medication List        Accurate as of February 17, 2023 11:17 AM. If you have any questions, ask your nurse or doctor.          STOP taking these medications    Jardiance 25 MG Tabs tablet Generic drug: empagliflozin       TAKE these medications    aspirin 81 MG chewable tablet Chew by mouth.   atorvastatin 40 MG tablet Commonly known as: LIPITOR Take by mouth.   glipiZIDE 5 MG tablet Commonly known as: GLUCOTROL Take by mouth.   metFORMIN 1000 MG tablet Commonly known as: GLUCOPHAGE Take by mouth.   OVER THE COUNTER MEDICATION CALCIUM 500 MG 1 TABLET DAILY   Ozempic (0.25 or 0.5 MG/DOSE) 2 MG/3ML Sopn Generic drug: Semaglutide(0.25 or 0.5MG /DOS) Inject 0.5 mg into the skin once a week.   pioglitazone 15 MG tablet Commonly known as: ACTOS Take 15 mg by mouth daily.   silodosin 8 MG Caps capsule Commonly known as: RAPAFLO Take 1 capsule (8 mg total) by mouth daily with breakfast.         Social History:  reports that he has never smoked. He has never used smokeless tobacco. He reports current alcohol use. He reports that he does not  use drugs.   Physical Exam: BP (!) 148/79   Pulse 86   Ht 6\' 2"  (1.88 m)   Wt 212 lb 4 oz (96.3 kg)   BMI 27.25 kg/m   Constitutional:  Alert and oriented, No acute distress. HEENT: [place] AT, moist mucus membranes.  Trachea midline, no masses. Neurologic: Grossly intact, no focal deficits, moving all 4 extremities. Psychiatric: Normal mood and affect.   Assessment & Plan:    Assessment & Plan:     1. Prostate cancer (HCC)  - Status post IMRT with 2 to 3 years of ADT therapy.  - Tolerating medication well. Experiencing some fatigue.  - PSA remains undetectable  - 8-month Depo given today, leuprolide (6 Month) (ELIGARD) injection 45 mg -  Considered high-risk and requires at least two years of treatment. - Recommend resuming calcium and vitamin D supplementation to prevent bone thinning due to hormone therapy. - Prescribe Vitamin D3 at 1,000 IU daily. - Discuss options for calcium supplements, such as chewable or gummy forms, to improve adherence.   2. Weak stream and nocturia - He reports improvement in urinary symptoms with Rapaflo and no dizziness, indicating good tolerance and effectiveness of the medication. - Continue current medication regimen with Rapaflo.  Return in about 6 months (around 08/18/2023) for for PSA and Eligard.  I have reviewed the above documentation for accuracy and completeness, and I agree with the above.   Randy Scotland, MD    [facility] [address], [address] [place], [place] [address] [phone]

## 2023-02-17 NOTE — Patient Instructions (Signed)
Take over the counter Vitamin D 800-1000iu and Calcium 1000-1200mg  daily

## 2023-08-04 ENCOUNTER — Other Ambulatory Visit: Payer: Self-pay

## 2023-08-04 DIAGNOSIS — C61 Malignant neoplasm of prostate: Secondary | ICD-10-CM

## 2023-08-06 ENCOUNTER — Other Ambulatory Visit: Payer: Self-pay

## 2023-08-06 DIAGNOSIS — C61 Malignant neoplasm of prostate: Secondary | ICD-10-CM

## 2023-08-07 LAB — PSA: Prostate Specific Ag, Serum: <0.1 ng/mL (ref 0.0–4.0)

## 2023-08-10 ENCOUNTER — Other Ambulatory Visit: Payer: Self-pay | Admitting: Urology

## 2023-08-10 DIAGNOSIS — C61 Malignant neoplasm of prostate: Secondary | ICD-10-CM

## 2023-08-11 ENCOUNTER — Ambulatory Visit (INDEPENDENT_AMBULATORY_CARE_PROVIDER_SITE_OTHER): Payer: Self-pay | Admitting: Urology

## 2023-08-11 ENCOUNTER — Other Ambulatory Visit: Payer: Self-pay

## 2023-08-11 VITALS — BP 128/76 | HR 102 | Ht 74.0 in | Wt 207.4 lb

## 2023-08-11 DIAGNOSIS — R350 Frequency of micturition: Secondary | ICD-10-CM

## 2023-08-11 DIAGNOSIS — R351 Nocturia: Secondary | ICD-10-CM | POA: Diagnosis not present

## 2023-08-11 DIAGNOSIS — C61 Malignant neoplasm of prostate: Secondary | ICD-10-CM

## 2023-08-11 LAB — MICROSCOPIC EXAMINATION: Bacteria, UA: NONE SEEN

## 2023-08-11 LAB — URINALYSIS, COMPLETE
Bilirubin, UA: NEGATIVE
Leukocytes,UA: NEGATIVE
Nitrite, UA: NEGATIVE
Protein,UA: NEGATIVE
Specific Gravity, UA: 1.015 (ref 1.005–1.030)
Urobilinogen, Ur: 0.2 mg/dL (ref 0.2–1.0)
pH, UA: 6 (ref 5.0–7.5)

## 2023-08-11 MED ORDER — SILODOSIN 8 MG PO CAPS: 8.0000 mg | ORAL_CAPSULE | Freq: Every day | ORAL | 3 refills | Status: AC

## 2023-08-11 NOTE — Progress Notes (Signed)
 [name],acting as a scribe for Dustin Gimenez, MD.,have documented all relevant documentation on the behalf of Dustin Gimenez, MD,as directed by  Dustin Gimenez, MD while in the presence of Dustin Gimenez, MD.  08/11/23 1:35 PM   Randy Mcmillan Feb 11, 1941 [id number]  Referring provider: Jimmy Moulding, MD [address] [facility] [place],  [place] [address]  Chief Complaint  Patient presents with   Prostate Cancer    HPI: 83 year old male patient, presents today for a six-month follow-up. He has a history of prostate cancer diagnosed in 2022 and completed treatment in February 2023.   His urinary symptoms have been managed on Rapaflo  and Eligard .  His PSA levels have remained undetectable throughout the treatment.   He is now being considered for discontinuation of Eligard  therapy. He reports experiencing hot flashes and nocturia, with frequent urination at night but not during the day. He denies having sleep apnea and reports being able to sleep well.   He expresses concern about the potential risk of bladder cancer due to his medications and inquires about the need for further imaging, such as an MRI.  Results for orders placed or performed in visit on 08/11/23  Microscopic Examination   Urine  Result Value Ref Range   WBC, UA 0-5 0 - 5 /hpf   RBC, Urine 0-2 0 - 2 /hpf   Epithelial Cells (non renal) 0-10 0 - 10 /hpf   Bacteria, UA None seen None seen/Few  Urinalysis, Complete  Result Value Ref Range   Specific Gravity, UA 1.015 1.005 - 1.030   pH, UA 6.0 5.0 - 7.5   Color, UA Yellow Yellow   Appearance Ur Clear Clear   Leukocytes,UA Negative Negative   Protein,UA Negative Negative/Trace   Glucose, UA 3+ (A) Negative   Ketones, UA Trace (A) Negative   RBC, UA Trace (A) Negative   Bilirubin, UA Negative Negative   Urobilinogen, Ur 0.2 0.2 - 1.0 mg/dL   Nitrite, UA Negative Negative   Microscopic Examination See below:      PMH: Past Medical History:  Diagnosis Date   Anxiety 09/02/2013   On rare 1/2 mg xanax   Last Assessment & Plan:  Doing well on rare xanax   Chronic kidney disease, stage III (moderate) (HCC) 09/12/2013   Essential (primary) hypertension 09/02/2013   Last Assessment & Plan:  Taking medications without noted side effects or dizziness.   Hyperlipidemia associated with type 2 diabetes mellitus (HCC) 09/02/2013   Last Assessment & Plan:  Diet for healthy cholesterol is being attempted and no clear myalgia's or other side effects are noted.   Tremor 09/02/2013   Last Assessment & Plan:  Tremor is essentially unchanged.   Type 2 diabetes mellitus (HCC) 09/02/2013   Last Assessment & Plan:  Has been working on a low fat diet and no myalgia's are present.     Home Medications:  Allergies as of 08/11/2023   No Known Allergies      Medication List        Accurate as of August 11, 2023  1:35 PM. If you have any questions, ask your nurse or doctor.          STOP taking these medications    pioglitazone 15 MG tablet Commonly known as: ACTOS Stopped by: Dustin Gimenez       TAKE these medications    aspirin 81 MG chewable tablet Chew by mouth.   atorvastatin  40 MG tablet Commonly known as: LIPITOR Take by mouth.   glipiZIDE 5 MG tablet Commonly known as: GLUCOTROL Take by mouth.   Jardiance 10 MG Tabs tablet Generic drug: empagliflozin Take 10 mg by mouth daily.   metFORMIN 1000 MG tablet Commonly known as: GLUCOPHAGE Take by mouth.   OVER THE COUNTER MEDICATION CALCIUM 500 MG 1 TABLET DAILY   Ozempic (0.25 or 0.5 MG/DOSE) 2 MG/3ML Sopn Generic drug: Semaglutide(0.25 or 0.5MG /DOS) Inject 0.5 mg into the skin once a week.   silodosin  8 MG Caps capsule Commonly known as: RAPAFLO  Take 1 capsule (8 mg total) by mouth daily with breakfast. What changed: See the new instructions. Changed by: Dustin Gimenez         Social History:  reports that he has never smoked.  He has never used smokeless tobacco. He reports current alcohol use. He reports that he does not use drugs.   Physical Exam: BP 128/76   Pulse (!) 102   Ht 6' 2 (1.88 m)   Wt 207 lb 6 oz (94.1 kg)   BMI 26.63 kg/m   Constitutional:  Alert and oriented, No acute distress. HEENT: [place] AT, moist mucus membranes.  Trachea midline, no masses. Neurologic: Grossly intact, no focal deficits, moving all 4 extremities. Psychiatric: Normal mood and affect.   Assessment & Plan:    1. Prostate Cancer - He has completed over two years of treatment with Eligard , and his PSA levels have remained undetectable.  - The decision is made to discontinue Eligard , as the risks of prolonged hormone therapy, such as bone thinning and muscle mass loss, outweigh the benefits at this point.  - He is informed that testosterone levels will take time to return to normal, and PSA levels will be monitored every six months to establish a new baseline.  - Recurrence is defined as a rise of two points above the baseline.  2. Urinary Symptoms - He is currently managed on Rapaflo  for secondary symptoms. He reports nocturia but denies sleep apnea.  - The plan includes monitoring urinary symptoms and ensuring he is emptying properly.  - If symptoms progress, further evaluation may be necessary.  3. Bladder Cancer Concerns - He expressed concerns about bladder cancer risk.  - There is no current indication for MRI screening, but a urine test will be conducted to check for microscopic blood, which could warrant further investigation.    Return in about 6 months (around 02/10/2024) for repeat PSA and evaluation of urinary symptoms.  I have reviewed the above documentation for accuracy and completeness, and I agree with the above.   Dustin Gimenez, MD   [facility] [address] [place], [place] [address] [phone]  Addendum: UA negative, no further work up indicated

## 2024-02-03 ENCOUNTER — Other Ambulatory Visit

## 2024-02-03 DIAGNOSIS — R351 Nocturia: Secondary | ICD-10-CM

## 2024-02-03 DIAGNOSIS — C61 Malignant neoplasm of prostate: Secondary | ICD-10-CM

## 2024-02-03 DIAGNOSIS — R350 Frequency of micturition: Secondary | ICD-10-CM

## 2024-02-04 LAB — PSA: Prostate Specific Ag, Serum: <0.1 ng/mL (ref 0.0–4.0)

## 2024-02-07 ENCOUNTER — Ambulatory Visit: Admitting: Physician Assistant

## 2024-02-09 NOTE — Assessment & Plan Note (Signed)
 Hx of prostate Ca  - GS 4+3=7, PSA 5.5 (dx 2023)  - s/p IMRT + 1.61yr ADT   PSAs remain undetectable, last on 02/03/24   - Transition to annual follow up with PSA. We may stagger visits w/ Rad Onc to have touchpoints ~q6 mo if needed

## 2024-02-09 NOTE — Progress Notes (Signed)
° °  02/10/2024 2:17 PM   Randy Mcmillan Randy Mcmillan. 09/06/1940 [id number]  Reason for visit: Follow up prostate Ca   HPI: 83 y.o. male, initial follow up with me today, previously seen by Dr. Penne in June 2025  First time meeting, very pleasant gentleman No interval GU issues  Prior HPI: Hx of prostate Ca  - GS 4+3=7, PSA 5.5 (dx 2023)  - s/p IMRT + 1.15yr ADT   Hx of BPH  - on Rapaflo   - Symptoms well-controlled  - IPSS 12/2 today    Physical Exam: BP 112/72   Pulse (!) 106   Ht 6' 1 (1.854 m)   Wt 204 lb 9.6 oz (92.8 kg)   BMI 26.99 kg/m    Constitutional:  Alert and oriented, No acute distress.  Laboratory Data: Component Ref Range & Units (hover) 6 d ago (02/03/24) 6 mo ago (08/06/23) 11 mo ago (02/15/23) 1 yr ago (08/13/22) 1 yr ago (02/12/22) 2 yr ago (08/07/21) 3 yr ago (12/11/20)  Prostate Specific Ag, Serum <0.1 <0.1 CM <0.1 CM <0.1 CM <0.1 CM <0.1 CM 5.5 High      Pertinent Imaging: N/A    Assessment & Plan:    Prostate cancer [facility]) Assessment & Plan: Hx of prostate Ca  - GS 4+3=7, PSA 5.5 (dx 2023)  - s/p IMRT + 1.2yr ADT   PSAs remain undetectable, last on 02/03/24   - Transition to annual follow up with PSA. We may stagger visits w/ Rad Onc to have touchpoints ~q6 mo if needed  Orders: -     Bladder Scan (Post Void Residual) in office  Benign prostatic hyperplasia with lower urinary tract symptoms, symptom details unspecified Assessment & Plan: On Rapaflo   IPSS 12/2 PVR 0 ml today  Overall content with urinary habits, bothersome Continue on Rapaflo , happy to refill as needed        Penne JONELLE Skye, MD  [facility] [address], [address] [place], [place] [address] [phone]

## 2024-02-10 ENCOUNTER — Ambulatory Visit: Admitting: Urology

## 2024-02-10 ENCOUNTER — Encounter: Payer: Self-pay | Admitting: Urology

## 2024-02-10 VITALS — BP 112/72 | HR 106 | Ht 73.0 in | Wt 204.6 lb

## 2024-02-10 DIAGNOSIS — C61 Malignant neoplasm of prostate: Secondary | ICD-10-CM | POA: Diagnosis not present

## 2024-02-10 DIAGNOSIS — N401 Enlarged prostate with lower urinary tract symptoms: Secondary | ICD-10-CM

## 2024-02-10 DIAGNOSIS — N400 Enlarged prostate without lower urinary tract symptoms: Secondary | ICD-10-CM | POA: Insufficient documentation

## 2024-02-10 LAB — BLADDER SCAN AMB NON-IMAGING

## 2024-02-10 NOTE — Addendum Note (Signed)
 Addended by: Janei Scheff E on: 02/10/2024 02:19 PM   Modules accepted: Orders

## 2024-02-10 NOTE — Assessment & Plan Note (Addendum)
 On Rapaflo   IPSS 12/2 PVR 0 ml today  Overall content with urinary habits, bothersome Continue on Rapaflo , happy to refill as needed

## 2025-02-05 ENCOUNTER — Other Ambulatory Visit

## 2025-02-15 ENCOUNTER — Ambulatory Visit: Admitting: Urology
